# Patient Record
Sex: Female | Born: 1945 | Race: White | Hispanic: No | Marital: Married | Smoking: Never smoker
Health system: Southern US, Community
[De-identification: ages and names within clinical notes are randomized; demographics above are authoritative.]

## PROBLEM LIST (undated history)

## (undated) DIAGNOSIS — Z923 Personal history of irradiation: Secondary | ICD-10-CM

## (undated) DIAGNOSIS — C541 Malignant neoplasm of endometrium: Secondary | ICD-10-CM

## (undated) HISTORY — DX: Personal history of irradiation: Z92.3

---

## 2015-05-24 ENCOUNTER — Emergency Department (HOSPITAL_COMMUNITY): Payer: Medicare Other

## 2015-05-24 ENCOUNTER — Encounter (HOSPITAL_COMMUNITY): Payer: Self-pay | Admitting: Emergency Medicine

## 2015-05-24 ENCOUNTER — Inpatient Hospital Stay (HOSPITAL_COMMUNITY)
Admission: EM | Admit: 2015-05-24 | Discharge: 2015-05-29 | DRG: 811 | Disposition: A | Discharge status: Home / Self Care | Payer: Medicare Other | Attending: Family Medicine | Admitting: Family Medicine

## 2015-05-24 DIAGNOSIS — Z8249 Family history of ischemic heart disease and other diseases of the circulatory system: Secondary | ICD-10-CM | POA: Diagnosis not present

## 2015-05-24 DIAGNOSIS — E871 Hypo-osmolality and hyponatremia: Secondary | ICD-10-CM | POA: Diagnosis present

## 2015-05-24 DIAGNOSIS — D259 Leiomyoma of uterus, unspecified: Secondary | ICD-10-CM | POA: Diagnosis present

## 2015-05-24 DIAGNOSIS — Z803 Family history of malignant neoplasm of breast: Secondary | ICD-10-CM

## 2015-05-24 DIAGNOSIS — D473 Essential (hemorrhagic) thrombocythemia: Secondary | ICD-10-CM | POA: Diagnosis present

## 2015-05-24 DIAGNOSIS — R634 Abnormal weight loss: Secondary | ICD-10-CM | POA: Diagnosis present

## 2015-05-24 DIAGNOSIS — R4702 Dysphasia: Secondary | ICD-10-CM | POA: Diagnosis present

## 2015-05-24 DIAGNOSIS — N63 Unspecified lump in unspecified breast: Secondary | ICD-10-CM | POA: Diagnosis present

## 2015-05-24 DIAGNOSIS — R011 Cardiac murmur, unspecified: Secondary | ICD-10-CM | POA: Diagnosis not present

## 2015-05-24 DIAGNOSIS — E43 Unspecified severe protein-calorie malnutrition: Secondary | ICD-10-CM | POA: Diagnosis present

## 2015-05-24 DIAGNOSIS — E860 Dehydration: Secondary | ICD-10-CM | POA: Diagnosis present

## 2015-05-24 DIAGNOSIS — N950 Postmenopausal bleeding: Secondary | ICD-10-CM | POA: Diagnosis present

## 2015-05-24 DIAGNOSIS — D72829 Elevated white blood cell count, unspecified: Secondary | ICD-10-CM | POA: Diagnosis present

## 2015-05-24 DIAGNOSIS — D508 Other iron deficiency anemias: Secondary | ICD-10-CM | POA: Diagnosis present

## 2015-05-24 DIAGNOSIS — B379 Candidiasis, unspecified: Secondary | ICD-10-CM | POA: Diagnosis present

## 2015-05-24 DIAGNOSIS — R1900 Intra-abdominal and pelvic swelling, mass and lump, unspecified site: Secondary | ICD-10-CM | POA: Diagnosis present

## 2015-05-24 DIAGNOSIS — Z6824 Body mass index (BMI) 24.0-24.9, adult: Secondary | ICD-10-CM | POA: Diagnosis not present

## 2015-05-24 DIAGNOSIS — B370 Candidal stomatitis: Secondary | ICD-10-CM | POA: Diagnosis present

## 2015-05-24 DIAGNOSIS — R1310 Dysphagia, unspecified: Secondary | ICD-10-CM | POA: Diagnosis present

## 2015-05-24 DIAGNOSIS — R718 Other abnormality of red blood cells: Secondary | ICD-10-CM | POA: Diagnosis present

## 2015-05-24 DIAGNOSIS — D649 Anemia, unspecified: Secondary | ICD-10-CM | POA: Diagnosis present

## 2015-05-24 DIAGNOSIS — N390 Urinary tract infection, site not specified: Secondary | ICD-10-CM | POA: Diagnosis present

## 2015-05-24 DIAGNOSIS — R1903 Right lower quadrant abdominal swelling, mass and lump: Secondary | ICD-10-CM | POA: Diagnosis not present

## 2015-05-24 DIAGNOSIS — R0602 Shortness of breath: Secondary | ICD-10-CM | POA: Diagnosis present

## 2015-05-24 LAB — BASIC METABOLIC PANEL
Anion gap: 8 (ref 5–15)
BUN: 11 mg/dL (ref 6–20)
CALCIUM: 9.1 mg/dL (ref 8.9–10.3)
CHLORIDE: 98 mmol/L — AB (ref 101–111)
CO2: 26 mmol/L (ref 22–32)
CREATININE: 0.48 mg/dL (ref 0.44–1.00)
GFR calc non Af Amer: >60 mL/min (ref >60)
GLUCOSE: 120 mg/dL — AB (ref 65–99)
Potassium: 3.9 mmol/L (ref 3.5–5.1)
Sodium: 132 mmol/L — ABNORMAL LOW (ref 135–145)

## 2015-05-24 LAB — CBC WITH DIFFERENTIAL/PLATELET
BASOS PCT: 0 %
Basophils Absolute: 0 10*3/uL (ref 0.0–0.1)
EOS PCT: 0 %
Eosinophils Absolute: 0 10*3/uL (ref 0.0–0.7)
HCT: 21.1 % — ABNORMAL LOW (ref 36.0–46.0)
HEMOGLOBIN: 6 g/dL — AB (ref 12.0–15.0)
Lymphocytes Relative: 6 %
Lymphs Abs: 2.3 10*3/uL (ref 0.7–4.0)
MCH: 17.8 pg — AB (ref 26.0–34.0)
MCHC: 28.4 g/dL — AB (ref 30.0–36.0)
MCV: 62.6 fL — AB (ref 78.0–100.0)
MONO ABS: 1.9 10*3/uL — AB (ref 0.1–1.0)
Monocytes Relative: 5 %
NEUTROS ABS: 34.1 10*3/uL — AB (ref 1.7–7.7)
NEUTROS PCT: 89 %
PLATELETS: 838 10*3/uL — AB (ref 150–400)
RBC: 3.37 MIL/uL — ABNORMAL LOW (ref 3.87–5.11)
RDW: 18.6 % — ABNORMAL HIGH (ref 11.5–15.5)
WBC: 38.3 10*3/uL — ABNORMAL HIGH (ref 4.0–10.5)

## 2015-05-24 LAB — HEPATIC FUNCTION PANEL
ALBUMIN: 1.8 g/dL — AB (ref 3.5–5.0)
ALT: 18 U/L (ref 14–54)
AST: 17 U/L (ref 15–41)
Alkaline Phosphatase: 171 U/L — ABNORMAL HIGH (ref 38–126)
BILIRUBIN TOTAL: 0.3 mg/dL (ref 0.3–1.2)
Bilirubin, Direct: 0.1 mg/dL (ref 0.1–0.5)
Indirect Bilirubin: 0.2 mg/dL — ABNORMAL LOW (ref 0.3–0.9)
TOTAL PROTEIN: 5.4 g/dL — AB (ref 6.5–8.1)

## 2015-05-24 LAB — URINALYSIS, ROUTINE W REFLEX MICROSCOPIC
BILIRUBIN URINE: NEGATIVE
GLUCOSE, UA: NEGATIVE mg/dL
KETONES UR: NEGATIVE mg/dL
NITRITE: NEGATIVE
PH: 5.5 (ref 5.0–8.0)
Protein, ur: 30 mg/dL — AB
SPECIFIC GRAVITY, URINE: 1.022 (ref 1.005–1.030)

## 2015-05-24 LAB — PROCALCITONIN: PROCALCITONIN: 0.36 ng/mL

## 2015-05-24 LAB — URINE MICROSCOPIC-ADD ON

## 2015-05-24 LAB — IRON AND TIBC
Iron: 6 ug/dL — ABNORMAL LOW (ref 28–170)
SATURATION RATIOS: 3 % — AB (ref 10.4–31.8)
TIBC: 196 ug/dL — ABNORMAL LOW (ref 250–450)
UIBC: 190 ug/dL

## 2015-05-24 LAB — FOLATE: Folate: 16.8 ng/mL (ref >5.9)

## 2015-05-24 LAB — PROTIME-INR
INR: 1.32 (ref 0.00–1.49)
PROTHROMBIN TIME: 16.5 s — AB (ref 11.6–15.2)

## 2015-05-24 LAB — RETICULOCYTES
RBC.: 3.24 MIL/uL — AB (ref 3.87–5.11)
RETIC COUNT ABSOLUTE: 97.2 10*3/uL (ref 19.0–186.0)
Retic Ct Pct: 3 % (ref 0.4–3.1)

## 2015-05-24 LAB — PREPARE RBC (CROSSMATCH)

## 2015-05-24 LAB — VITAMIN B12: Vitamin B-12: 1307 pg/mL — ABNORMAL HIGH (ref 180–914)

## 2015-05-24 LAB — POC OCCULT BLOOD, ED: FECAL OCCULT BLD: NEGATIVE

## 2015-05-24 LAB — FERRITIN: FERRITIN: 149 ng/mL (ref 11–307)

## 2015-05-24 LAB — LACTATE DEHYDROGENASE: LDH: 128 U/L (ref 98–192)

## 2015-05-24 LAB — APTT: APTT: 31 s (ref 24–37)

## 2015-05-24 MED ORDER — ACETAMINOPHEN 325 MG PO TABS
650.0000 mg | ORAL_TABLET | Freq: Four times a day (QID) | ORAL | Status: DC | PRN
Start: 2015-05-24 — End: 2015-05-29
  Administered 2015-05-27 – 2015-05-29 (×2): 650 mg via ORAL
  Filled 2015-05-24 (×2): qty 2

## 2015-05-24 MED ORDER — CETYLPYRIDINIUM CHLORIDE 0.05 % MT LIQD
7.0000 mL | Freq: Two times a day (BID) | OROMUCOSAL | Status: DC
  Administered 2015-05-29: 7 mL via OROMUCOSAL

## 2015-05-24 MED ORDER — DEXTROSE 5 % IV SOLN
1.0000 g | INTRAVENOUS | Status: DC
  Administered 2015-05-25 – 2015-05-26 (×2): 1 g via INTRAVENOUS
  Filled 2015-05-24 (×2): qty 10

## 2015-05-24 MED ORDER — IOHEXOL 300 MG/ML  SOLN
25.0000 mL | Freq: Once | INTRAMUSCULAR | Status: AC | PRN
Start: 2015-05-24 — End: 2015-05-24
  Administered 2015-05-24: 25 mL via ORAL

## 2015-05-24 MED ORDER — ONDANSETRON HCL 4 MG/2ML IJ SOLN: 4.0000 mg | Freq: Four times a day (QID) | INTRAMUSCULAR | Status: DC | PRN

## 2015-05-24 MED ORDER — ACETAMINOPHEN 325 MG PO TABS
650.0000 mg | ORAL_TABLET | Freq: Once | ORAL | Status: AC
  Administered 2015-05-24: 650 mg via ORAL
  Filled 2015-05-24: qty 2

## 2015-05-24 MED ORDER — SODIUM CHLORIDE 0.9 % IV SOLN
Freq: Once | INTRAVENOUS | Status: AC
  Administered 2015-05-24: via INTRAVENOUS

## 2015-05-24 MED ORDER — DOCUSATE SODIUM 100 MG PO CAPS
100.0000 mg | ORAL_CAPSULE | Freq: Two times a day (BID) | ORAL | Status: DC
  Filled 2015-05-24 (×6): qty 1

## 2015-05-24 MED ORDER — SODIUM CHLORIDE 0.9 % IJ SOLN
3.0000 mL | Freq: Two times a day (BID) | INTRAMUSCULAR | Status: DC
  Administered 2015-05-24 – 2015-05-28 (×8): 3 mL via INTRAVENOUS

## 2015-05-24 MED ORDER — VITAMIN B-1 100 MG PO TABS
100.0000 mg | ORAL_TABLET | Freq: Every day | ORAL | Status: DC
  Administered 2015-05-25 – 2015-05-29 (×5): 100 mg via ORAL
  Filled 2015-05-24 (×5): qty 1

## 2015-05-24 MED ORDER — DIPHENHYDRAMINE HCL 25 MG PO CAPS
25.0000 mg | ORAL_CAPSULE | Freq: Once | ORAL | Status: AC
  Administered 2015-05-24: 25 mg via ORAL
  Filled 2015-05-24: qty 1

## 2015-05-24 MED ORDER — FLUCONAZOLE IN SODIUM CHLORIDE 400-0.9 MG/200ML-% IV SOLN
400.0000 mg | INTRAVENOUS | Status: DC
  Administered 2015-05-24 – 2015-05-27 (×4): 400 mg via INTRAVENOUS
  Filled 2015-05-24 (×4): qty 200

## 2015-05-24 MED ORDER — CHLORHEXIDINE GLUCONATE 0.12 % MT SOLN
15.0000 mL | Freq: Two times a day (BID) | OROMUCOSAL | Status: DC
  Administered 2015-05-24 – 2015-05-28 (×4): 15 mL via OROMUCOSAL
  Filled 2015-05-24 (×7): qty 15

## 2015-05-24 MED ORDER — IOHEXOL 300 MG/ML  SOLN
100.0000 mL | Freq: Once | INTRAMUSCULAR | Status: AC | PRN
  Administered 2015-05-24: 100 mL via INTRAVENOUS

## 2015-05-24 MED ORDER — SENNA 8.6 MG PO TABS
1.0000 | ORAL_TABLET | Freq: Two times a day (BID) | ORAL | Status: DC
  Filled 2015-05-24 (×6): qty 1

## 2015-05-24 MED ORDER — FOLIC ACID 1 MG PO TABS
1.0000 mg | ORAL_TABLET | Freq: Every day | ORAL | Status: DC
  Administered 2015-05-25 – 2015-05-29 (×5): 1 mg via ORAL
  Filled 2015-05-24 (×5): qty 1

## 2015-05-24 MED ORDER — DEXTROSE 5 % IV SOLN
1.0000 g | Freq: Once | INTRAVENOUS | Status: AC
  Administered 2015-05-24: 1 g via INTRAVENOUS
  Filled 2015-05-24: qty 10

## 2015-05-24 MED ORDER — SODIUM CHLORIDE 0.9 % IV SOLN
Freq: Once | INTRAVENOUS | Status: AC
  Administered 2015-05-24: 10 mL via INTRAVENOUS

## 2015-05-24 MED ORDER — ONDANSETRON HCL 4 MG PO TABS: 4.0000 mg | ORAL_TABLET | Freq: Four times a day (QID) | ORAL | Status: DC | PRN

## 2015-05-24 MED ORDER — ACETAMINOPHEN 650 MG RE SUPP
650.0000 mg | Freq: Four times a day (QID) | RECTAL | Status: DC | PRN
Start: 2015-05-24 — End: 2015-05-29

## 2015-05-24 NOTE — ED Notes (Signed)
Unable to obtain lab work due to patient receiving blood at this time.
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
## 2015-05-24 NOTE — ED Notes (Signed)
Patient presents from home via EMS for weakness and sob with exertion. Patient had labs drawn at PCP yesterday, HBG 6.1, advised to come here for transfusion.   Last VS: 140/60, 100hr, 98%ra, 16resp

## 2015-05-24 NOTE — ED Notes (Signed)
Hgb 6.0 Called by lab RN aware.
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
## 2015-05-24 NOTE — Progress Notes (Signed)
Pt states she saw Ovid Curd at [place] Could not recall his last name  Cm provided her with Cristela Blue Hamrick and Cyndi Bender information  EPIC updated

## 2015-05-24 NOTE — Progress Notes (Signed)
ANTIBIOTIC CONSULT NOTE - INITIAL  Pharmacy Consult for Fluconazole Indication: Esophagitis  No Known Allergies  Patient Measurements: Height: 5' (152.4 cm) Weight: 123 lb 0.3 oz (55.8 kg) IBW/kg (Calculated) : 45.5  Vital Signs: Temp: 99.3 F (37.4 C) (12/16 2159) Temp Source: Oral (12/16 2159) BP: 143/56 mmHg (12/16 2159) Pulse Rate: 102 (12/16 2159) Intake/Output from previous day:   Intake/Output from this shift:    Labs:  Recent Labs  05/24/15 1555  WBC 38.3*  HGB 6.0*  PLT 838*  CREATININE 0.48   Estimated Creatinine Clearance: 52 mL/min (by C-G formula based on Cr of 0.48). No results for input(s): VANCOTROUGH, VANCOPEAK, VANCORANDOM, GENTTROUGH, GENTPEAK, GENTRANDOM, TOBRATROUGH, TOBRAPEAK, TOBRARND, AMIKACINPEAK, AMIKACINTROU, AMIKACIN in the last 72 hours.   Microbiology: No results found for this or any previous visit (from the past 720 hour(s)).  Medical History: History reviewed. No pertinent past medical history.  Medications:  Scheduled:  . sodium chloride   Intravenous Once  . acetaminophen  650 mg Oral Once  . [START ON 05/25/2015] cefTRIAXone (ROCEPHIN) IVPB 1 gram/50 mL D5W  1 g Intravenous Q24H  . diphenhydrAMINE  25 mg Oral Once  . docusate sodium  100 mg Oral BID  . fluconazole (DIFLUCAN) IV  400 mg Intravenous Q24H  . [START ON [id number] folic acid  1 mg Oral Daily  . senna  1 tablet Oral BID  . sodium chloride  3 mL Intravenous Q12H  . [START ON 05/25/2015] thiamine  100 mg Oral Daily   Infusions:   Assessment:  69 yr female with no known significant PMH, presents with shortness of breath and fatigue, difficulty swallowing, recent 20 lb weight loss.  CT abdomen notes abdominal mass  Pt with thrush and concerned for esophagitis  Pharmacy consulted to dose IV fluconazole  12/16 >>Ceftriaxone >> 12/16 >>Fluconazole >>    12/16 blood: 12/16 urine:  Goal of Therapy:  Eradication of infection  Plan:  Follow up culture  results  Fluconazole 400mg  IV q24h Follow renal function  Takeela Peil, Toribio Harbour, PharmD 05/24/2015,10:30 PM

## 2015-05-25 ENCOUNTER — Inpatient Hospital Stay (HOSPITAL_COMMUNITY): Payer: Medicare Other

## 2015-05-25 DIAGNOSIS — R011 Cardiac murmur, unspecified: Secondary | ICD-10-CM

## 2015-05-25 LAB — CBC WITH DIFFERENTIAL/PLATELET
BASOS PCT: 0 %
Basophils Absolute: 0 10*3/uL (ref 0.0–0.1)
Basophils Absolute: 0 10*3/uL (ref 0.0–0.1)
Basophils Relative: 0 %
EOS ABS: 0 10*3/uL (ref 0.0–0.7)
EOS PCT: 0 %
EOS PCT: 0 %
Eosinophils Absolute: 0 10*3/uL (ref 0.0–0.7)
HCT: 32.1 % — ABNORMAL LOW (ref 36.0–46.0)
HEMATOCRIT: 28.5 % — AB (ref 36.0–46.0)
HEMOGLOBIN: 9.8 g/dL — AB (ref 12.0–15.0)
HEMOGLOBIN: 8.9 g/dL — AB (ref 12.0–15.0)
LYMPHS ABS: 1.9 10*3/uL (ref 0.7–4.0)
Lymphocytes Relative: 6 %
Lymphocytes Relative: 5 %
Lymphs Abs: 2.3 10*3/uL (ref 0.7–4.0)
MCH: 21.3 pg — AB (ref 26.0–34.0)
MCH: 21.4 pg — ABNORMAL LOW (ref 26.0–34.0)
MCHC: 30.5 g/dL (ref 30.0–36.0)
MCHC: 31.2 g/dL (ref 30.0–36.0)
MCV: 69.6 fL — AB (ref 78.0–100.0)
MCV: 68.7 fL — ABNORMAL LOW (ref 78.0–100.0)
MONO ABS: 1.9 10*3/uL — AB (ref 0.1–1.0)
MONOS PCT: 5 %
Monocytes Absolute: 1.9 10*3/uL — ABNORMAL HIGH (ref 0.1–1.0)
Monocytes Relative: 5 %
NEUTROS ABS: 34.1 10*3/uL — AB (ref 1.7–7.7)
NEUTROS PCT: 89 %
NEUTROS PCT: 90 %
Neutro Abs: 33.2 10*3/uL — ABNORMAL HIGH (ref 1.7–7.7)
PLATELETS: 683 10*3/uL — AB (ref 150–400)
Platelets: 684 10*3/uL — ABNORMAL HIGH (ref 150–400)
RBC: 4.61 MIL/uL (ref 3.87–5.11)
RBC: 4.15 MIL/uL (ref 3.87–5.11)
RDW: 23.9 % — ABNORMAL HIGH (ref 11.5–15.5)
RDW: 23.8 % — ABNORMAL HIGH (ref 11.5–15.5)
WBC: 38.3 10*3/uL — ABNORMAL HIGH (ref 4.0–10.5)
WBC: 37 10*3/uL — AB (ref 4.0–10.5)

## 2015-05-25 LAB — LIPID PANEL
CHOL/HDL RATIO: 3.6 ratio
CHOLESTEROL: 90 mg/dL (ref 0–200)
HDL: 25 mg/dL — ABNORMAL LOW (ref >40)
LDL CALC: 47 mg/dL (ref 0–99)
TRIGLYCERIDES: 89 mg/dL (ref <150)
VLDL: 18 mg/dL (ref 0–40)

## 2015-05-25 LAB — COMPREHENSIVE METABOLIC PANEL
ALT: 21 U/L (ref 14–54)
ANION GAP: 8 (ref 5–15)
AST: 17 U/L (ref 15–41)
Albumin: 2 g/dL — ABNORMAL LOW (ref 3.5–5.0)
Alkaline Phosphatase: 181 U/L — ABNORMAL HIGH (ref 38–126)
BUN: 9 mg/dL (ref 6–20)
CHLORIDE: 104 mmol/L (ref 101–111)
CO2: 26 mmol/L (ref 22–32)
Calcium: 9.7 mg/dL (ref 8.9–10.3)
Creatinine, Ser: 0.48 mg/dL (ref 0.44–1.00)
GFR calc non Af Amer: >60 mL/min (ref >60)
Glucose, Bld: 104 mg/dL — ABNORMAL HIGH (ref 65–99)
POTASSIUM: 4.7 mmol/L (ref 3.5–5.1)
SODIUM: 138 mmol/L (ref 135–145)
Total Bilirubin: 1.5 mg/dL — ABNORMAL HIGH (ref 0.3–1.2)
Total Protein: 5.9 g/dL — ABNORMAL LOW (ref 6.5–8.1)

## 2015-05-25 LAB — BRAIN NATRIURETIC PEPTIDE
B NATRIURETIC PEPTIDE 5: 190.8 pg/mL — AB (ref 0.0–100.0)
B Natriuretic Peptide: 261.5 pg/mL — ABNORMAL HIGH (ref 0.0–100.0)

## 2015-05-25 LAB — TSH: TSH: 2.31 u[IU]/mL (ref 0.350–4.500)

## 2015-05-25 LAB — TROPONIN I
Troponin I: <0.03 ng/mL (ref <0.031)
Troponin I: <0.03 ng/mL (ref <0.031)

## 2015-05-25 LAB — CORTISOL-AM, BLOOD: Cortisol - AM: 27.7 ug/dL — ABNORMAL HIGH (ref 6.7–22.6)

## 2015-05-25 LAB — HIV ANTIBODY (ROUTINE TESTING W REFLEX): HIV Screen 4th Generation wRfx: NONREACTIVE

## 2015-05-25 LAB — PREPARE RBC (CROSSMATCH)

## 2015-05-25 LAB — ABO/RH: ABO/RH(D): O POS

## 2015-05-25 LAB — PHOSPHORUS
Phosphorus: 3.2 mg/dL (ref 2.5–4.6)
Phosphorus: 3.7 mg/dL (ref 2.5–4.6)

## 2015-05-25 LAB — PROTIME-INR
INR: 1.39 (ref 0.00–1.49)
Prothrombin Time: 17.1 seconds — ABNORMAL HIGH (ref 11.6–15.2)

## 2015-05-25 LAB — HAPTOGLOBIN: HAPTOGLOBIN: 422 mg/dL — AB (ref 34–200)

## 2015-05-25 LAB — APTT: aPTT: 35 seconds (ref 24–37)

## 2015-05-25 LAB — CK: CK TOTAL: 10 U/L — AB (ref 38–234)

## 2015-05-25 LAB — PREALBUMIN: PREALBUMIN: 3.9 mg/dL — AB (ref 18–38)

## 2015-05-25 LAB — MAGNESIUM: MAGNESIUM: 1.9 mg/dL (ref 1.7–2.4)

## 2015-05-25 MED ORDER — NYSTATIN 100000 UNIT/GM EX POWD
Freq: Two times a day (BID) | CUTANEOUS | Status: DC
  Filled 2015-05-25: qty 15

## 2015-05-25 MED ORDER — NYSTATIN 100000 UNIT/ML MT SUSP
5.0000 mL | Freq: Two times a day (BID) | OROMUCOSAL | Status: DC
  Administered 2015-05-25 – 2015-05-29 (×9): 500000 [IU] via ORAL
  Filled 2015-05-25 (×9): qty 5
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
## 2015-05-25 NOTE — Evaluation (Signed)
Clinical/Bedside Swallow Evaluation Patient Details  Name: Cindy Anderson MRN: [id number] Date of Birth: 28-Aug-1945  Today's Date: 05/25/2015 Time: SLP Start Time (ACUTE ONLY): [id number] SLP Stop Time (ACUTE ONLY): 1346 SLP Time Calculation (min) (ACUTE ONLY): 18 min  Past Medical History: History reviewed. No pertinent past medical history. Past Surgical History: History reviewed. No pertinent past surgical history. HPI:  Patient have been gradually getting more short of breath over past month and has some fatigue. She reports having trouble swallowing due to food getting stuck in her throat. She reports ankle swelling since October that has getting worse. She denies Pica, no melena, no blood in stools, no vaginal bleeding. She never had colonoscopy or mammogram due to lack of medical care. Reports area that she lives in hard to get a PCP. She reports loss of energy Since November. For the past months she has sensation of choking on food. She thinks she lost over 20 Lb since November. She reports food aversions. She reports burning in her mouth. For the past month. Yesterday she presented to a physician at [facility] and had lab work done that showed hemoglobin of 6.1 at which point she was told to present to emergency department. Patient called 911 and was brought to [facility] along emergency department by EMS.   In emergency department patient was found to have hemoglobin of 6.0. White blood cell count 38.3 predominately neutrophils 89 percentile. Platelets of 838 MCV 62.6 monocytes absolute count being slightly elevated 149 lymphocytes absolute count, was 2.3    Assessment / Plan / Recommendation Clinical Impression  Clinical swallowing evaluation was completed.  Oral mechnism exam was normal.  The patient's oral and pharyngeal swallow appeared to be functional.  Swallow trigger was timely and hyo-laryngeal excursion was deemed adequate.  Mastication of dry solids was functional.  Overt s/s of  aspiration were not observed.  The patient complained of material sticking just above the sternal notch.  Liquids facilitated clearance of that sensation.  The patient felt that things had been improving since initiation of treatment for her thrush.  Her symptoms are most consistent with esopahgeal dysphagia.  Recommend continue with a regular diet and thin liquids.  Alternating food/liquids and sitting up for at least 30 mins after intake to facilitate esophageal clearance.  ST to follow up for therapeutic diet tolerance and education.      Aspiration Risk  Mild aspiration risk    Diet Recommendation   Regular diet and thin liquids.    Medication Administration: Whole meds with liquid    Other  Recommendations Oral Care Recommendations: Oral care BID   Follow up Recommendations   (TBD)    Frequency and Duration min 2x/week  2 weeks       Prognosis Prognosis for Safe Diet Advancement: Good      Swallow Study   General Date of Onset: 05/24/15 HPI: Patient have been gradually getting more short of breath over past month and has some fatigue. She reports having trouble swallowing due to food getting stuck in her throat. She reports ankle swelling since October that has getting worse. She denies Pica, no melena, no blood in stools, no vaginal bleeding. She never had colonoscopy or mammogram due to lack of medical care. Reports area that she lives in hard to get a PCP. She reports loss of energy Since November. For the past months she has sensation of choking on food. She thinks she lost over 20 Lb since November. She reports food aversions. She  reports burning in her mouth. For the past month. Yesterday she presented to a physician at [facility] and had lab work done that showed hemoglobin of 6.1 at which point she was told to present to emergency department. Patient called 911 and was brought to [facility] along emergency department by EMS.   In emergency department patient was found  to have hemoglobin of 6.0. White blood cell count 38.3 predominately neutrophils 89 percentile. Platelets of 838 MCV 62.6 monocytes absolute count being slightly elevated 149 lymphocytes absolute count, was 2.3  Type of Study: Bedside Swallow Evaluation Previous Swallow Assessment: None noted.   Diet Prior to this Study: Regular;Thin liquids Temperature Spikes Noted: Yes Respiratory Status: Room air History of Recent Intubation: No Behavior/Cognition: Alert;Cooperative;Pleasant mood Oral Cavity Assessment: Other (comment) (White patches noted on her tongue c/w thrush.  ) Oral Care Completed by SLP: No Oral Cavity - Dentition: Adequate natural dentition Vision: Functional for self-feeding Self-Feeding Abilities: Able to feed self Patient Positioning: Upright in chair Baseline Vocal Quality: Normal Volitional Cough: Strong Volitional Swallow: Able to elicit    Oral/Motor/Sensory Function Overall Oral Motor/Sensory Function: Within functional limits   Ice Chips Ice chips: Not tested   Thin Liquid Thin Liquid: Within functional limits Presentation: Cup;Self Fed;Spoon    Nectar Thick Nectar Thick Liquid: Not tested   Honey Thick Honey Thick Liquid: Not tested   Puree Puree: Within functional limits Presentation: Spoon;Self Fed   Solid Solid: Within functional limits Presentation: Cindy Anderson, Cindy Anderson, Cindy Anderson [phone] Cindy Anderson N 05/25/2015,1:54 PM

## 2015-05-25 NOTE — Progress Notes (Signed)
Echocardiogram 2D Echocardiogram has been performed.  Cindy Anderson 05/25/2015, 9:20 AM

## 2015-05-26 DIAGNOSIS — R718 Other abnormality of red blood cells: Secondary | ICD-10-CM

## 2015-05-26 DIAGNOSIS — N63 Unspecified lump in breast: Secondary | ICD-10-CM

## 2015-05-26 LAB — COMPREHENSIVE METABOLIC PANEL
ALBUMIN: 1.7 g/dL — AB (ref 3.5–5.0)
ALT: 17 U/L (ref 14–54)
AST: 18 U/L (ref 15–41)
Alkaline Phosphatase: 168 U/L — ABNORMAL HIGH (ref 38–126)
Anion gap: 8 (ref 5–15)
BUN: 9 mg/dL (ref 6–20)
CHLORIDE: 106 mmol/L (ref 101–111)
CO2: 24 mmol/L (ref 22–32)
Calcium: 9.2 mg/dL (ref 8.9–10.3)
Creatinine, Ser: 0.5 mg/dL (ref 0.44–1.00)
GFR calc Af Amer: >60 mL/min (ref >60)
GFR calc non Af Amer: >60 mL/min (ref >60)
GLUCOSE: 106 mg/dL — AB (ref 65–99)
POTASSIUM: 3.8 mmol/L (ref 3.5–5.1)
SODIUM: 138 mmol/L (ref 135–145)
Total Bilirubin: 0.6 mg/dL (ref 0.3–1.2)
Total Protein: 5.1 g/dL — ABNORMAL LOW (ref 6.5–8.1)

## 2015-05-26 LAB — CBC WITH DIFFERENTIAL/PLATELET
BASOS PCT: 0 %
Basophils Absolute: 0 10*3/uL (ref 0.0–0.1)
EOS PCT: 0 %
Eosinophils Absolute: 0 10*3/uL (ref 0.0–0.7)
HEMATOCRIT: 26.7 % — AB (ref 36.0–46.0)
Hemoglobin: 8.3 g/dL — ABNORMAL LOW (ref 12.0–15.0)
LYMPHS ABS: 1.9 10*3/uL (ref 0.7–4.0)
Lymphocytes Relative: 6 %
MCH: 21.3 pg — AB (ref 26.0–34.0)
MCHC: 31.1 g/dL (ref 30.0–36.0)
MCV: 68.6 fL — AB (ref 78.0–100.0)
MONOS PCT: 5 %
Monocytes Absolute: 1.6 10*3/uL — ABNORMAL HIGH (ref 0.1–1.0)
NEUTROS ABS: 28.2 10*3/uL — AB (ref 1.7–7.7)
Neutrophils Relative %: 89 %
Platelets: 579 10*3/uL — ABNORMAL HIGH (ref 150–400)
RBC: 3.89 MIL/uL (ref 3.87–5.11)
RDW: 24.3 % — AB (ref 11.5–15.5)
WBC: 31.7 10*3/uL — AB (ref 4.0–10.5)

## 2015-05-26 LAB — URINE CULTURE

## 2015-05-26 MED ORDER — SODIUM CHLORIDE 0.9 % IV SOLN
510.0000 mg | Freq: Once | INTRAVENOUS | Status: AC
  Administered 2015-05-26: 510 mg via INTRAVENOUS
  Filled 2015-05-26: qty 17

## 2015-05-26 MED ORDER — ENSURE ENLIVE PO LIQD
237.0000 mL | Freq: Two times a day (BID) | ORAL | Status: DC
  Administered 2015-05-26 – 2015-05-29 (×5): 237 mL via ORAL
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
## 2015-05-26 NOTE — Progress Notes (Signed)
Initial Nutrition Assessment  DOCUMENTATION CODES:   Severe malnutrition in context of acute illness/injury  INTERVENTION:   Provide Ensure Enlive po BID, each supplement provides 350 kcal and 20 grams of protein Encourage PO intake RD to continue to monitor  NUTRITION DIAGNOSIS:   Malnutrition related to acute illness as evidenced by percent weight loss, energy intake < or equal to 50% for > or equal to 5 days, moderate depletion of body fat, moderate depletions of muscle mass.  GOAL:   Patient will meet greater than or equal to 90% of their needs  MONITOR:   PO intake, Supplement acceptance, Labs, Weight trends, Skin, I & O's  REASON FOR ASSESSMENT:   Consult Assessment of nutrition requirement/status  ASSESSMENT:   Patient have been gradually getting more short of breath over past month and has some fatigue. She reports having trouble swallowing due to food getting stuck in her throat. She reports ankle swelling since October that has getting worse. She denies Pica, no melena, no blood in stools, no vaginal bleeding. She never had colonoscopy or mammogram due to lack of medical care. Reports area that she lives in hard to get a PCP. She reports loss of energy Since November. For the past months she has sensation of choking on food. She thinks she lost over 20 Lb since November.  Pt reports around 20 lb weight loss since November when her swallowing issues began. Pt developed thrush during this time as well. Pt reports food getting stuck in her throat and then she started having issues with liquids as well. SLP evaluated pt and recommends regular diet and thin liquids with mild aspiration precautions. Pt states she is to have another swallow eval tomorrow. RD to order pt Ensure supplements.   Pt has lost 25 lb since November (17% weight loss x 1.5 months, significant for time frame).  Nutrition-Focused physical exam completed. Findings are moderate fat depletion, moderate  muscle depletion, and moderate edema.   Labs reviewed: Mg/Phos WNL  Diet Order:  Diet regular Room service appropriate?: Yes; Fluid consistency:: Thin  Skin:  Reviewed, no issues  Last BM:  12/17  Height:   Ht Readings from Last 1 Encounters:  05/24/15 5' (1.524 m)    Weight:   Wt Readings from Last 1 Encounters:  05/24/15 123 lb 0.3 oz (55.8 kg)    Ideal Body Weight:  45.5 kg  BMI:  Body mass index is 24.03 kg/(m^2).  Estimated Nutritional Needs:   Kcal:  1650-1850  Protein:  75-85g  Fluid:  1.8L/day  EDUCATION NEEDS:   Education needs addressed  Clayton Bibles, MS, RD, LDN Pager: [phone] After Hours Pager: [phone]

## 2015-05-26 NOTE — Evaluation (Signed)
Physical Therapy Evaluation-1x Patient Details Name: Branesha Mcintyre MRN: [id number] DOB: August 23, 1945 Today's Date: 05/26/2015   History of Present Illness  69 yo female admitted with symptomatic anemia, weakness.   Clinical Impression  On eval, pt was supervision level for mobility-walked ~500 feet. No overt LOB. Mildly unsteady at times. Encouraged pt to continue ambulating in hallway. Do not anticipate any follow up PT needs. Will sign off.     Follow Up Recommendations No PT follow up    Equipment Recommendations  None recommended by PT    Recommendations for Other Services       Precautions / Restrictions Restrictions Weight Bearing Restrictions: No      Mobility  Bed Mobility               General bed mobility comments: NT-pt sitting in window seat  Transfers Overall transfer level: Modified independent                  Ambulation/Gait Ambulation/Gait assistance: Supervision Ambulation Distance (Feet): 500 Feet Assistive device: None Gait Pattern/deviations: Step-through pattern;Decreased stride length     General Gait Details: slow gait speed. mild unsteadiness at times but no LOB  Financial trader Rankin (Stroke Patients Only)       Balance Overall balance assessment: No apparent balance deficits (not formally assessed)                                           Pertinent Vitals/Pain Pain Assessment: 0-10 Pain Score: 6  Pain Location: back pain Pain Intervention(s): Monitored during session    Home Living Family/patient expects to be discharged to:: Private residence (a Friend's home) Living Arrangements: Non-relatives/Friends   Type of Home: House       Home Layout: One level Home Equipment: None      Prior Function Level of Independence: Independent               Hand Dominance        Extremity/Trunk Assessment   Upper Extremity Assessment: Overall WFL  for tasks assessed           Lower Extremity Assessment: Overall WFL for tasks assessed      Cervical / Trunk Assessment: Normal  Communication   Communication: No difficulties  Cognition Arousal/Alertness: Awake/alert Behavior During Therapy: WFL for tasks assessed/performed Overall Cognitive Status: Within Functional Limits for tasks assessed                      General Comments      Exercises        Assessment/Plan    PT Assessment Patent does not need any further PT services (may need to consider Orthopedic consult if back pain continues)  PT Diagnosis     PT Problem List    PT Treatment Interventions     PT Goals (Current goals can be found in the Care Plan section) Acute Rehab PT Goals Patient Stated Goal: home soon.  PT Goal Formulation: All assessment and education complete, DC therapy    Frequency     Barriers to discharge        Co-evaluation               End of Session   Activity Tolerance: Patient tolerated treatment well Patient  left: in chair;with call bell/phone within reach           Time: 1450-1516 PT Time Calculation (min) (ACUTE ONLY): 26 min   Charges:   PT Evaluation $Initial PT Evaluation Tier I: 1 Procedure     PT G Codes:        Weston Anna, MPT Pager: [phone]

## 2015-05-27 ENCOUNTER — Inpatient Hospital Stay (HOSPITAL_COMMUNITY): Payer: Medicare Other

## 2015-05-27 ENCOUNTER — Other Ambulatory Visit: Payer: Self-pay | Admitting: Oncology

## 2015-05-27 DIAGNOSIS — D630 Anemia in neoplastic disease: Secondary | ICD-10-CM

## 2015-05-27 LAB — COMPREHENSIVE METABOLIC PANEL
ALT: 15 U/L (ref 14–54)
AST: 14 U/L — AB (ref 15–41)
Albumin: 1.6 g/dL — ABNORMAL LOW (ref 3.5–5.0)
Alkaline Phosphatase: 143 U/L — ABNORMAL HIGH (ref 38–126)
Anion gap: 6 (ref 5–15)
BUN: 9 mg/dL (ref 6–20)
CHLORIDE: 106 mmol/L (ref 101–111)
CO2: 26 mmol/L (ref 22–32)
CREATININE: 0.47 mg/dL (ref 0.44–1.00)
Calcium: 8.8 mg/dL — ABNORMAL LOW (ref 8.9–10.3)
GFR calc Af Amer: >60 mL/min (ref >60)
Glucose, Bld: 126 mg/dL — ABNORMAL HIGH (ref 65–99)
Potassium: 3.7 mmol/L (ref 3.5–5.1)
Sodium: 138 mmol/L (ref 135–145)
Total Bilirubin: 0.6 mg/dL (ref 0.3–1.2)
Total Protein: 4.6 g/dL — ABNORMAL LOW (ref 6.5–8.1)

## 2015-05-27 LAB — HEMOGLOBINOPATHY EVALUATION
HGB F QUANT: 0 % (ref 0.0–2.0)
Hgb A2 Quant: 1.8 % (ref 0.7–3.1)
Hgb A: 98.2 % — ABNORMAL HIGH (ref 94.0–98.0)
Hgb C: 0 %
Hgb S Quant: 0 %

## 2015-05-27 LAB — CBC
HEMATOCRIT: 28.4 % — AB (ref 36.0–46.0)
HEMOGLOBIN: 8.6 g/dL — AB (ref 12.0–15.0)
MCH: 21.5 pg — ABNORMAL LOW (ref 26.0–34.0)
MCHC: 30.3 g/dL (ref 30.0–36.0)
MCV: 71 fL — ABNORMAL LOW (ref 78.0–100.0)
Platelets: 681 10*3/uL — ABNORMAL HIGH (ref 150–400)
RBC: 4 MIL/uL (ref 3.87–5.11)
RDW: 25.6 % — ABNORMAL HIGH (ref 11.5–15.5)
WBC: 32.2 10*3/uL — AB (ref 4.0–10.5)

## 2015-05-27 LAB — HEMOGLOBIN A1C
HEMOGLOBIN A1C: 6.6 % — AB (ref 4.8–5.6)
Mean Plasma Glucose: 143 mg/dL

## 2015-05-27 LAB — CALCIUM, IONIZED: CALCIUM, IONIZED, SERUM: 5.7 mg/dL — AB (ref 4.5–5.6)

## 2015-05-27 NOTE — Progress Notes (Unsigned)
I met with patient and her husband approximately 30 minutes this evening to go over her situation. She could simply have post menopausal uterine bleeding due to fibroids, with consequejnt iron deficiency explaining the microcytic anemia and thrombocytosis, with the leukocytosis reactive to this process (possibly intrauterine inflammation). Or she could have uterine cancer, and/or chronic myeloid leukemia, and in that case the thrombocytosis would be part of the neoplastic process. She could also carry a thalassemia.  We went over all this and I have made her a new patient appt with me for 1/6 . However if she has not yet had the anticipated hysterectomy by then I will postpone the visit.  She has a good understanding of this plan and agrees with it. Please let me know if I can be of further help.

## 2015-05-27 NOTE — Progress Notes (Addendum)
Speech Language Pathology Treatment: Dysphagia  Patient Details Name: Cindy Anderson MRN: [id number] DOB: 10/20/1945 Today's Date: 05/27/2015 Time: 0962-8366 SLP Time Calculation (min) (ACUTE ONLY): 30 min  Assessment / Plan / Recommendation Clinical Impression  Note results of esophagram showing pill lodging in lower cervical esophagus - with pt awareness per her statement.  Pt also with mild reflux and small sliding-scale hiatal hernia.   Pt verbalized sensing pill lodging and food/pills lodging 1-2 times a day since mid November requiring her to "take deep breaths" or drink gingerale/water mixture to aid clearance.  She does report improvement with thrush treatment as she has decreased pain with swallowing.  Gustatory changes also reported by pt occuring prior to admission.    SLP reviewed compensations to mitigate dysphagia symptoms and provided with pt being able to teach back.  Will sign off at this time as pt with primary esophageal deficits and she appears to be managing well.  She expressed gratitude for information.      HPI HPI: Patient have been gradually getting more short of breath over past month and has some fatigue. She reports having trouble swallowing due to food getting stuck in her throat. She reports ankle swelling since October that has getting worse. She denies Pica, no melena, no blood in stools, no vaginal bleeding. She never had colonoscopy or mammogram due to lack of medical care. Reports area that she lives in hard to get a PCP. She reports loss of energy Since November. For the past months she has sensation of choking on food. She thinks she lost over 20 Lb since November. She reports food aversions. She reports burning in her mouth. For the past month. Yesterday she presented to a physician at [facility] and had lab work done that showed hemoglobin of 6.1 at which point she was told to present to emergency department. Patient called 911 and was brought to  [facility] along emergency department by EMS.   In emergency department patient was found to have hemoglobin of 6.0. White blood cell count 38.3 predominately neutrophils 89 percentile. Platelets of 838 MCV 62.6 monocytes absolute count being slightly elevated 149 lymphocytes absolute count, was 2.3       SLP Plan  All goals met;Discharge SLP treatment due to (comment)     Recommendations  Medication Administration: Whole meds with liquid Supervision: Patient able to self feed Compensations: Slow rate;Small sips/bites (start meal with liquids, follow solids with liquids) Postural Changes and/or Swallow Maneuvers: Seated upright 90 degrees;Upright 30-60 min after meal              Oral Care Recommendations: Oral care BID Follow up Recommendations: None (TBD) Plan: All goals met;Discharge SLP treatment due to (comment)  Luanna Salk, [place] [facility] SLP [phone]

## 2015-05-27 NOTE — Progress Notes (Signed)
Pharmacy Antibiotic Follow-up Note  Cindy Anderson is a 69 y.o. year-old female admitted on 05/24/2015.  The patient is currently on day 4 of Fluconazole 400mg  IV daily for thrush, esophagitis, dysphagia.  Assessment/Plan: This patient's current antibiotics will be continued without adjustments.  Temp (24hrs), Avg:99 F (37.2 C), Min:98.1 F (36.7 C), Max:99.8 F (37.7 C)   Recent Labs Lab 05/24/15 1555 05/25/15 0532 05/25/15 1243 05/26/15 0516  WBC 38.3* 38.3* 37.0* 31.7*    Recent Labs Lab 05/24/15 1555 05/25/15 0532 05/26/15 0516 05/27/15 0414  CREATININE 0.48 0.48 0.50 0.47   Estimated Creatinine Clearance: 52 mL/min (by C-G formula based on Cr of 0.47).    No Known Allergies  Antimicrobials this admission: 12/16 >> CTX >> 12/19 12/16 >> Fluconazole >>   Levels/dose changes this admission:  None  Microbiology results: 12/16 blood x 2: NGTD 12/16 urine: multiple species, suggest recollection 12/17 HIV antibody: non-reactive  Thank you for allowing pharmacy to be a part of this patient's care.  Gretta Arab PharmD, BCPS Pager [phone] 05/27/2015 1:40 PM

## 2015-05-27 NOTE — Care Management Important Message (Signed)
Important Message  Patient Details  Name: Cindy Anderson MRN: [id number] Date of Birth: [birth year]   Medicare Important Message Given:  Yes    Camillo Flaming 05/27/2015, 11:42 AMImportant Message  Patient Details  Name: Cindy Anderson MRN: [id number] Date of Birth: [birth year]   Medicare Important Message Given:  Yes    Camillo Flaming 05/27/2015, 11:42 AM

## 2015-05-27 NOTE — Progress Notes (Signed)
[facility] PROGRESS NOTE  Cindy Anderson [id number] DOB: 22-Oct-1945 DOA: 05/24/2015 PCP: [facility]  Assessment/Plan: 1. Severe Iron defi anemia - suspect due to ongoing post menopausal bleeding due for months now -CT with markedly enlarged uterus with large fibroid with central degeneration -S/p 2 units PRBC -I called and d/w GYN on call, Dr.Arnold, he felt that this long standing issue needs workup and management but could be done in outpatient setting, and that not uncommon to see gas in degenerating fibroid -made FU with Dr.Arnold for 12/22 -given IV Iron 12/18, repeat in 1-2weeks  2. Leukocytosis/thrombocytosis - etiology unclear, i called and d/w Dr.Magrinat, thrombocytosis could be due to severe Iron deficiency -degree of leukocytosis still unexplained, will order BCl-ABL to r/o CLL and Hemoglobin electrophoresis to r/o thalassemia per Dr.Magrinat -Smear with mild left shift, schistocytes, LDH and haptoglobin WNL -no s/s of ongoing infection, afebrile, blood Cx negative -abnormal UA, on Ceftriaxone, FU Cx-do not think this is accounting for degree of leukocytosis, Cx negative stop ceftriaxone  3. Dysphagia/Thrush -Diflucan/Nystatin -SLP eval completed,  Esophagram without overt findings , could have esoph webs associated with iron defi -FU with GI  4. Hyponatremia -from volume depletion, improved  5. Severe malnutrition -RD consult  6. Breast nodule -needs Mammogram upon FU with Gyn  DVT proph; SCDs  Code Status: Full Code Family Communication: none at bedside Disposition Plan:Home tomorrow   Consultants:  D/w GYN  Heme -pending  HPI/Subjective: Feels ok, intermittent vaginal bleeding for months  Objective: Filed Vitals:   05/27/15 0605 05/27/15 1529  BP: 149/60 125/68  Pulse: 94 83  Temp: 98.1 F (36.7 C) 98.3 F (36.8 C)  Resp: 20 20    Intake/Output Summary (Last 24 hours) at 05/27/15 1754 Last data filed at 05/27/15  1100  Gross per 24 hour  Intake    730 ml  Output      0 ml  Net    730 ml   Filed Weights   05/24/15 2159  Weight: 55.8 kg (123 lb 0.3 oz)    Exam:   General:  AAOx3  Cardiovascular:S1S2/RRR  Respiratory: CTAB  Abdomen: soft, NT, BS present  Musculoskeletal: no edema c/c   Data Reviewed: Basic Metabolic Panel:  Recent Labs Lab 05/24/15 1555 05/24/15 2300 05/25/15 0532 05/26/15 0516 05/27/15 0414  NA 132*  --  138 138 138  K 3.9  --  4.7 3.8 3.7  CL 98*  --  104 106 106  CO2 26  --  26 24 26   GLUCOSE 120*  --  104* 106* 126*  BUN 11  --  9 9 9   CREATININE 0.48  --  0.48 0.50 0.47  CALCIUM 9.1  --  9.7 9.2 8.8*  MG  --   --  1.9  --   --   PHOS  --  3.2 3.7  --   --    Liver Function Tests:  Recent Labs Lab 05/24/15 1935 05/25/15 0532 05/26/15 0516 05/27/15 0414  AST 17 17 18  14*  ALT 18 21 17 15   ALKPHOS 171* 181* 168* 143*  BILITOT 0.3 1.5* 0.6 0.6  PROT 5.4* 5.9* 5.1* 4.6*  ALBUMIN 1.8* 2.0* 1.7* 1.6*   No results for input(s): LIPASE, AMYLASE in the last 168 hours. No results for input(s): AMMONIA in the last 168 hours. CBC:  Recent Labs Lab 05/24/15 1555 05/25/15 0532 05/25/15 1243 05/26/15 0516  WBC 38.3* 38.3* 37.0* 31.7*  NEUTROABS 34.1* 34.1* 33.2* 28.2*  HGB  6.0* 9.8* 8.9* 8.3*  HCT 21.1* 32.1* 28.5* 26.7*  MCV 62.6* 69.6* 68.7* 68.6*  PLT 838* 683* 684* 579*   Cardiac Enzymes:  Recent Labs Lab 05/24/15 2300 2015/06/14 0532  CKTOTAL 10*  --   TROPONINI <0.03 <0.03   BNP (last 3 results)  Recent Labs  05/24/15 2300 2015/06/14 0532  BNP 261.5* 190.8*    ProBNP (last 3 results) No results for input(s): PROBNP in the last 8760 hours.  CBG: No results for input(s): GLUCAP in the last 168 hours.  Recent Results (from the past 240 hour(s))  Blood culture (routine x 2)     Status: None (Preliminary result)   Collection Time: 05/24/15  6:20 PM  Result Value Ref Range Status   Specimen Description BLOOD RIGHT  ANTECUBITAL  Final   Special Requests IN PEDIATRIC BOTTLE 3 CC  Final   Culture   Final    NO GROWTH 3 DAYS Performed at [facility]    Report Status PENDING  Incomplete  Blood culture (routine x 2)     Status: None (Preliminary result)   Collection Time: 05/24/15  6:30 PM  Result Value Ref Range Status   Specimen Description BLOOD RIGHT HAND  Final   Special Requests BOTTLES DRAWN AEROBIC AND ANAEROBIC 5 CC  Final   Culture   Final    NO GROWTH 3 DAYS Performed at [facility]    Report Status PENDING  Incomplete  Urine culture     Status: None   Collection Time: 05/24/15 10:03 PM  Result Value Ref Range Status   Specimen Description URINE, CLEAN CATCH  Final   Special Requests NONE  Final   Culture   Final    MULTIPLE SPECIES PRESENT, SUGGEST RECOLLECTION Performed at [facility]    Report Status 05/26/2015 FINAL  Final     Studies: US Transvaginal Non-ob  06-14-15  CLINICAL DATA:  Fibroid uterus EXAM: TRANSABDOMINAL AND TRANSVAGINAL ULTRASOUND OF PELVIS TECHNIQUE: Both transabdominal and transvaginal ultrasound examinations of the pelvis were performed. Transabdominal technique was performed for global imaging of the pelvis including uterus, ovaries, adnexal regions, and pelvic cul-de-sac. It was necessary to proceed with endovaginal exam following the transabdominal exam to visualize the endometrium and ovaries. COMPARISON:  None FINDINGS: Uterus Measurements: Prominent size, 20.0 cm length by 9.5 cm AP x 13.5 cm transverse. Markedly heterogeneous and nodular appearing myometrium compatible with multiple leiomyomata as seen on CT. Multiple shadowing calcifications. Large mass at fundus 10.2 x 6.6 x 11.8 cm containing calcifications with shadowing, minimal fluid and probable foci of gas. Additional large solid-appearing leiomyoma at lower uterine segment 9.9 x 7.6 x 10.0 cm, also corresponding to leiomyoma on CT. Endometrium Thickness: Not visualized,  obscured by the numerous uterine masses identified. Right ovary Not visualized likely due to markedly enlarged uterus, displacement, and obscuration by bowel and uterine calcifications. Left ovary Not visualized likely due to markedly enlarged uterus, displacement and obscuration by bowel and uterine calcifications. Other findings No free fluid. IMPRESSION: Markedly enlarged nodular uterus containing multiple uterine leiomyomata, some which are calcified, others of which are solid or necrotic, overall better visualized on prior CT. Unable to exclude malignant degeneration of leiomyomata by ultrasound; if further uterine evaluation is required, MR imaging with and without contrast would be the preferred modality. Electronically Signed   By: Lavonia Dana M.D.   On: June 14, 2015 18:30   US Pelvis Complete  06/14/2015  CLINICAL DATA:  Fibroid uterus EXAM: TRANSABDOMINAL AND TRANSVAGINAL  ULTRASOUND OF PELVIS TECHNIQUE: Both transabdominal and transvaginal ultrasound examinations of the pelvis were performed. Transabdominal technique was performed for global imaging of the pelvis including uterus, ovaries, adnexal regions, and pelvic cul-de-sac. It was necessary to proceed with endovaginal exam following the transabdominal exam to visualize the endometrium and ovaries. COMPARISON:  None FINDINGS: Uterus Measurements: Prominent size, 20.0 cm length by 9.5 cm AP x 13.5 cm transverse. Markedly heterogeneous and nodular appearing myometrium compatible with multiple leiomyomata as seen on CT. Multiple shadowing calcifications. Large mass at fundus 10.2 x 6.6 x 11.8 cm containing calcifications with shadowing, minimal fluid and probable foci of gas. Additional large solid-appearing leiomyoma at lower uterine segment 9.9 x 7.6 x 10.0 cm, also corresponding to leiomyoma on CT. Endometrium Thickness: Not visualized, obscured by the numerous uterine masses identified. Right ovary Not visualized likely due to markedly enlarged  uterus, displacement, and obscuration by bowel and uterine calcifications. Left ovary Not visualized likely due to markedly enlarged uterus, displacement and obscuration by bowel and uterine calcifications. Other findings No free fluid. IMPRESSION: Markedly enlarged nodular uterus containing multiple uterine leiomyomata, some which are calcified, others of which are solid or necrotic, overall better visualized on prior CT. Unable to exclude malignant degeneration of leiomyomata by ultrasound; if further uterine evaluation is required, MR imaging with and without contrast would be the preferred modality. Electronically Signed   By: Lavonia Dana M.D.   On: 05/25/2015 18:30   Dg Esophagus  05/27/2015  CLINICAL DATA:  69 year old female with dysphagia and thrush. Subsequent encounter. EXAM: ESOPHOGRAM/BARIUM SWALLOW TECHNIQUE: Combined double contrast and single contrast examination performed using effervescent crystals, thick barium liquid, and thin barium liquid. FLUOROSCOPY TIME:  Radiation Exposure Index (as provided by the fluoroscopic device): 31.2 micro Gray Fluoroscopy Time:  3 minutes and 8 seconds. COMPARISON:  Report of recent swallow function study available. FINDINGS: When the patient ingested a 13 mm barium tablet, this became lodged in the lower cervical esophagus where there is smooth narrowing. Barium tablet cleared when it was partially dissolved. Normal primary esophageal stripping wave. No focal mucosal abnormality noted. Mild spontaneous gastroesophageal reflux into the region of the distal esophagus. Very small sliding-type hiatal hernia. IMPRESSION: 13 mm barium tablet became lodged in the lower cervical esophagus where there is smooth narrowing. Barium tablet only cleared when it was partially dissolved. Normal primary esophageal stripping wave. No focal mucosal abnormality noted. Mild spontaneous gastroesophageal reflux into the region of the distal esophagus. Very small sliding-type hiatal  hernia. Electronically Signed   By: Genia Del M.D.   On: 05/27/2015 11:15    Scheduled Meds: . antiseptic oral rinse  7 mL Mouth Rinse q12n4p  . chlorhexidine  15 mL Mouth Rinse BID  . docusate sodium  100 mg Oral BID  . feeding supplement (ENSURE ENLIVE)  237 mL Oral BID BM  . fluconazole (DIFLUCAN) IV  400 mg Intravenous Q24H  . folic acid  1 mg Oral Daily  . nystatin  5 mL Oral BID  . senna  1 tablet Oral BID  . sodium chloride  3 mL Intravenous Q12H  . thiamine  100 mg Oral Daily   Continuous Infusions:  Antibiotics Given (last 72 hours)    Date/Time Action Medication Dose Rate   05/25/15 2249 Given   cefTRIAXone (ROCEPHIN) 1 g in dextrose 5 % 50 mL IVPB 1 g 100 mL/hr   05/26/15 2246 Given   cefTRIAXone (ROCEPHIN) 1 g in dextrose 5 % 50 mL IVPB 1 g  100 mL/hr      Active Problems:   Symptomatic anemia   Leucocytosis   UTI (lower urinary tract infection)   Hyponatremia   Dysphagia   Thrush   Abdominal mass, RLQ (right lower quadrant)   Abnormal weight loss   Dehydration   Schistocytes on peripheral blood smear   Breast nodule    Time spent: 39min    [facility] Pager [phone]. If 7PM-7AM, please contact night-coverage at [email or web address], password [facility] 05/27/2015, 5:54 PM  LOS: 3 days  "The rings associated with iron deficiency

## 2015-05-28 ENCOUNTER — Telehealth: Payer: Self-pay | Admitting: Oncology

## 2015-05-28 LAB — CBC
HEMATOCRIT: 24.3 % — AB (ref 36.0–46.0)
HEMOGLOBIN: 7.5 g/dL — AB (ref 12.0–15.0)
MCH: 21.6 pg — ABNORMAL LOW (ref 26.0–34.0)
MCHC: 30.9 g/dL (ref 30.0–36.0)
MCV: 70 fL — AB (ref 78.0–100.0)
Platelets: 534 10*3/uL — ABNORMAL HIGH (ref 150–400)
RBC: 3.47 MIL/uL — AB (ref 3.87–5.11)
RDW: 25.6 % — ABNORMAL HIGH (ref 11.5–15.5)
WBC: 27.6 10*3/uL — AB (ref 4.0–10.5)

## 2015-05-28 LAB — TYPE AND SCREEN
ABO/RH(D): O POS
ANTIBODY SCREEN: NEGATIVE
UNIT DIVISION: 0
UNIT DIVISION: 0
Unit division: 0

## 2015-05-28 LAB — HEMOGLOBIN AND HEMATOCRIT, BLOOD
HCT: 33.3 % — ABNORMAL LOW (ref 36.0–46.0)
Hemoglobin: 10.5 g/dL — ABNORMAL LOW (ref 12.0–15.0)

## 2015-05-28 LAB — PREPARE RBC (CROSSMATCH)

## 2015-05-28 LAB — BASIC METABOLIC PANEL
ANION GAP: 8 (ref 5–15)
BUN: 7 mg/dL (ref 6–20)
CHLORIDE: 103 mmol/L (ref 101–111)
CO2: 26 mmol/L (ref 22–32)
Calcium: 8.6 mg/dL — ABNORMAL LOW (ref 8.9–10.3)
Creatinine, Ser: 0.41 mg/dL — ABNORMAL LOW (ref 0.44–1.00)
GFR calc non Af Amer: >60 mL/min (ref >60)
GLUCOSE: 100 mg/dL — AB (ref 65–99)
POTASSIUM: 3.5 mmol/L (ref 3.5–5.1)
Sodium: 137 mmol/L (ref 135–145)

## 2015-05-28 LAB — PATHOLOGIST SMEAR REVIEW

## 2015-05-28 MED ORDER — FERROUS FUMARATE 90 MG PO TABS: 1.0000 | ORAL_TABLET | Freq: Two times a day (BID) | ORAL | Status: DC

## 2015-05-28 MED ORDER — PANTOPRAZOLE SODIUM 40 MG PO TBEC
40.0000 mg | DELAYED_RELEASE_TABLET | Freq: Every day | ORAL | Status: DC
  Administered 2015-05-28 – 2015-05-29 (×2): 40 mg via ORAL
  Filled 2015-05-28 (×2): qty 1

## 2015-05-28 MED ORDER — FUROSEMIDE 20 MG PO TABS: 20.0000 mg | ORAL_TABLET | Freq: Every day | ORAL | Status: DC

## 2015-05-28 MED ORDER — PANTOPRAZOLE SODIUM 40 MG PO TBEC: 40.0000 mg | DELAYED_RELEASE_TABLET | Freq: Every day | ORAL | Status: DC

## 2015-05-28 MED ORDER — FLUCONAZOLE 100 MG PO TABS: 100.0000 mg | ORAL_TABLET | Freq: Every day | ORAL | Status: DC

## 2015-05-28 MED ORDER — SODIUM CHLORIDE 0.9 % IV SOLN: Freq: Once | INTRAVENOUS | Status: DC

## 2015-05-28 MED ORDER — FUROSEMIDE 10 MG/ML IJ SOLN
20.0000 mg | Freq: Once | INTRAMUSCULAR | Status: AC
  Administered 2015-05-28: 20 mg via INTRAVENOUS
  Filled 2015-05-28: qty 2

## 2015-05-28 MED ORDER — FLUCONAZOLE 100 MG PO TABS
100.0000 mg | ORAL_TABLET | Freq: Every day | ORAL | Status: DC
  Administered 2015-05-28 – 2015-05-29 (×2): 100 mg via ORAL
  Filled 2015-05-28 (×2): qty 1

## 2015-05-28 NOTE — Progress Notes (Signed)
Spoke with pt concerning PCP.  Pt states, "I just went to [facility], and they are the ones that found my low blood count and sent me here.  I want to keep [facility] and will continue to follow up with them."

## 2015-05-28 NOTE — Progress Notes (Signed)
Nutrition Follow-up  DOCUMENTATION CODES:   Severe malnutrition in context of acute illness/injury  INTERVENTION:  - Continue Ensure Enlive BID - Encourage PO intakes of meals and supplements - RD will continue to monitor for needs  NUTRITION DIAGNOSIS:   Malnutrition related to acute illness as evidenced by percent weight loss, energy intake < or equal to 50% for > or equal to 5 days, moderate depletion of body fat, moderate depletions of muscle mass. -ongoing  GOAL:   Patient will meet greater than or equal to 90% of their needs -variably met  MONITOR:   PO intake, Supplement acceptance, Labs, Weight trends, Skin, I & O's  REASON FOR ASSESSMENT:   Consult Assessment of nutrition requirement/status (and diet education)  ASSESSMENT:   Patient have been gradually getting more short of breath over past month and has some fatigue. She reports having trouble swallowing due to food getting stuck in her throat. She reports ankle swelling since October that has getting worse. She denies Pica, no melena, no blood in stools, no vaginal bleeding. She never had colonoscopy or mammogram due to lack of medical care. Reports area that she lives in hard to get a PCP. She reports loss of energy Since November. For the past months she has sensation of choking on food. She thinks she lost over 20 Lb since November.  12/20 New consult received. Per chart review, pt ate 100% of lunch and dinner yesterday (12/19) and 100% of breakfast this AM which she reports consisted of raisin bran with milk, fruit cocktail, a banana, and coffee. Pt reports slight improvement in swallowing with slight improvement in thrush on medication for the same. She also states that PTA her tastes were "dulled" but that this AM it seemed that she was able to taste foods slightly better than PTA. SLP saw pt yesterday and pt was d/c'ed from SLP service.  Pt states that she had esophagram yesterday and that it showed a large  pill stuck in esophageal region. She states that MD advised her not to consume bites of food larger than this pill. Pt states that she has been encouraged to consume low Na foods and increase protein intake. She has been drinking Ensure Enlive once/day without issue. Encouraged pt to increase to BID and to aim to eat 35 grams of protein between breakfast, lunch, and dinner (35 grams from 3 meals + 40 grams from Ensure Enlive BID = 75 grams of protein/day). Provided pt with "Protein Content of Foods" list/handout from the [employer]. Went over handout with pt and answered all questions.  Pt beginning to meet needs. Medications reviewed. Labs reviewed; creatinine low, Ca: 8.6 mg/dL, Alk Phos elevated.    12/18 - Pt reports around 20 lb weight loss since November when her swallowing issues began.  - Pt developed thrush during this time as well.  - Pt reports food getting stuck in her throat and then she started having issues with liquids as well.  - SLP evaluated pt and recommends regular diet and thin liquids with mild aspiration precautions. - Pt states she is to have another swallow eval tomorrow.  - RD to order pt Ensure supplements.  - Pt has lost 25 lb since November (17% weight loss x 1.5 months, significant for time frame). - Nutrition-Focused physical exam completed. Findings are moderate fat depletion, moderate muscle depletion, and moderate edema.    Diet Order:  Diet regular Room service appropriate?: Yes; Fluid consistency:: Thin  Skin:  Reviewed,  no issues  Last BM:  12/19  Height:   Ht Readings from Last 1 Encounters:  05/24/15 5' (1.524 m)    Weight:   Wt Readings from Last 1 Encounters:  05/24/15 123 lb 0.3 oz (55.8 kg)    Ideal Body Weight:  45.5 kg  BMI:  Body mass index is 24.03 kg/(m^2).  Estimated Nutritional Needs:   Kcal:  1650-1850  Protein:  75-85g  Fluid:  1.8L/day  EDUCATION NEEDS:   Education needs  addressed     Jarome Matin, RD, LDN Inpatient Clinical Dietitian Pager # [phone] After hours/weekend pager # [phone]
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
## 2015-05-28 NOTE — Telephone Encounter (Signed)
Staff message forwarded to Tiffany in HIM re request for new pt appt from [facility] per 12/19 pof - hosp referral.

## 2015-05-29 ENCOUNTER — Telehealth: Payer: Self-pay | Admitting: Oncology

## 2015-05-29 LAB — CBC
HEMATOCRIT: 30 % — AB (ref 36.0–46.0)
Hemoglobin: 9.3 g/dL — ABNORMAL LOW (ref 12.0–15.0)
MCH: 22.7 pg — AB (ref 26.0–34.0)
MCHC: 31 g/dL (ref 30.0–36.0)
MCV: 73.2 fL — AB (ref 78.0–100.0)
Platelets: 529 10*3/uL — ABNORMAL HIGH (ref 150–400)
RBC: 4.1 MIL/uL (ref 3.87–5.11)
RDW: 26.8 % — AB (ref 11.5–15.5)
WBC: 29.3 10*3/uL — AB (ref 4.0–10.5)

## 2015-05-29 LAB — BASIC METABOLIC PANEL
ANION GAP: 9 (ref 5–15)
BUN: 8 mg/dL (ref 6–20)
CALCIUM: 8.9 mg/dL (ref 8.9–10.3)
CO2: 27 mmol/L (ref 22–32)
Chloride: 104 mmol/L (ref 101–111)
Creatinine, Ser: 0.51 mg/dL (ref 0.44–1.00)
GFR calc Af Amer: >60 mL/min (ref >60)
GFR calc non Af Amer: >60 mL/min (ref >60)
GLUCOSE: 139 mg/dL — AB (ref 65–99)
POTASSIUM: 3.2 mmol/L — AB (ref 3.5–5.1)
Sodium: 140 mmol/L (ref 135–145)

## 2015-05-29 LAB — TYPE AND SCREEN
ABO/RH(D): O POS
ANTIBODY SCREEN: NEGATIVE
UNIT DIVISION: 0

## 2015-05-29 LAB — CULTURE, BLOOD (ROUTINE X 2)
Culture: NO GROWTH
Culture: NO GROWTH

## 2015-05-29 NOTE — Telephone Encounter (Signed)
new patient appt-RM# [id number] ss/w patient and gave np appt for 01/06 @ 10 w/labs @ 9:30.  Appt confirmed.

## 2015-05-29 NOTE — Progress Notes (Signed)
Patient reviewed Tired from getting up to go to RR 2/2 to lasix Still having some heavy bleeding No other concerns really Long discussion with patient about HR and its relation to anemia She is not dizzy nor lightheaded and has no cp but a little LE swelling She is stable to go home and already has a close follow-up as per Dr. Moody Bruins, MD [facility] (P) [phone]

## 2015-05-30 ENCOUNTER — Encounter: Payer: Self-pay | Admitting: Obstetrics & Gynecology

## 2015-05-30 ENCOUNTER — Ambulatory Visit (INDEPENDENT_AMBULATORY_CARE_PROVIDER_SITE_OTHER): Payer: Medicare Other | Admitting: Obstetrics & Gynecology

## 2015-05-30 ENCOUNTER — Other Ambulatory Visit (HOSPITAL_COMMUNITY)
Admission: RE | Admit: 2015-05-30 | Discharge: 2015-05-30 | Disposition: A | Discharge status: Home / Self Care | Payer: Medicare Other | Source: Ambulatory Visit | Attending: Obstetrics & Gynecology | Admitting: Obstetrics & Gynecology

## 2015-05-30 VITALS — BP 137/60 | HR 101 | Temp 98.4°F | Ht 61.0 in | Wt 120.1 lb

## 2015-05-30 DIAGNOSIS — D259 Leiomyoma of uterus, unspecified: Secondary | ICD-10-CM | POA: Diagnosis present

## 2015-05-30 DIAGNOSIS — N889 Noninflammatory disorder of cervix uteri, unspecified: Secondary | ICD-10-CM | POA: Diagnosis present

## 2015-05-30 MED ORDER — MEGESTROL ACETATE 40 MG PO TABS: 80.0000 mg | ORAL_TABLET | Freq: Two times a day (BID) | ORAL | Status: DC

## 2015-05-30 NOTE — Patient Instructions (Signed)
Uterine Fibroids Uterine fibroids are tissue masses (tumors) that can develop in the womb (uterus). They are also called leiomyomas. This type of tumor is not cancerous (benign) and does not spread to other parts of the body outside of the pelvic area, which is between the hip bones. Occasionally, fibroids may develop in the fallopian tubes, in the cervix, or on the support structures (ligaments) that surround the uterus. You can have one or many fibroids. Fibroids can vary in size, weight, and where they grow in the uterus. Some can become quite large. Most fibroids do not require medical treatment. CAUSES A fibroid can develop when a single uterine cell keeps growing (replicating). Most cells in the human body have a control mechanism that keeps them from replicating without control. SIGNS AND SYMPTOMS Symptoms may include:   Heavy bleeding during your period.  Bleeding or spotting between periods.  Pelvic pain and pressure.  Bladder problems, such as needing to urinate more often (urinary frequency) or urgently.  Inability to reproduce offspring (infertility).  Miscarriages. DIAGNOSIS Uterine fibroids are diagnosed through a physical exam. Your health care provider may feel the lumpy tumors during a pelvic exam. Ultrasonography and an MRI may be done to determine the size, location, and number of fibroids. TREATMENT Treatment may include:  Watchful waiting. This involves getting the fibroid checked by your health care provider to see if it grows or shrinks. Follow your health care provider's recommendations for how often to have this checked.  Hormone medicines. These can be taken by mouth or given through an intrauterine device (IUD).  Surgery.  Removing the fibroids (myomectomy) or the uterus (hysterectomy).  Removing blood supply to the fibroids (uterine artery embolization). If fibroids interfere with your fertility and you want to become pregnant, your health care provider  may recommend having the fibroids removed.  HOME CARE INSTRUCTIONS  Keep all follow-up visits as directed by your health care provider. This is important.  Take medicines only as directed by your health care provider.  If you were prescribed a hormone treatment, take the hormone medicines exactly as directed.  Do not take aspirin, because it can cause bleeding.  Ask your health care provider about taking iron pills and increasing the amount of dark green, leafy vegetables in your diet. These actions can help to boost your blood iron levels, which may be affected by heavy menstrual bleeding.  Pay close attention to your period and tell your health care provider about any changes, such as:  Increased blood flow that requires you to use more pads or tampons than usual per month.  A change in the number of days that your period lasts per month.  A change in symptoms that are associated with your period, such as abdominal cramping or back pain. SEEK MEDICAL CARE IF:  You have pelvic pain, back pain, or abdominal cramps that cannot be controlled with medicines.  You have an increase in bleeding between and during periods.  You soak tampons or pads in a half hour or less.  You feel lightheaded, extra tired, or weak. SEEK IMMEDIATE MEDICAL CARE IF:  You faint.  You have a sudden increase in pelvic pain.   This information is not intended to replace advice given to you by your health care provider. Make sure you discuss any questions you have with your health care provider.   Document Released: 05/22/2000 Document Revised: 06/15/2014 Document Reviewed: 11/21/2013 Elsevier Interactive Patient Education 2016 [employer]vier Inc.  

## 2015-05-30 NOTE — Progress Notes (Signed)
Scheduled pt with GYN/Oncology for December 29th @ 0915 with Dr. Everitt Amber.  Called pt and LM to return call concerning appt.

## 2015-05-30 NOTE — Progress Notes (Signed)
Patient ID: Cindy Anderson, female   DOB: 1945-10-01, 69 y.o.   MRN: [id number]  Chief Complaint  Patient presents with  . Fibroids  vaginal bleeding for 3 months, anemia  HPI Cindy Anderson is a 69 y.o. female.  G1P1001 Postmenopausal at age 60, with vaginal bleeding occasionally heavy for 3 months. She was admitted to to [facility] 05/24/15 with weakness and anemia, Hb 6.1. Imaging showed large degenerating uterine masses. She received 3 units of PRBC.  HPI Patient Active Problem List   Diagnosis Date Noted  . Symptomatic anemia 05/24/2015  . Leucocytosis 05/24/2015  . UTI (lower urinary tract infection) 05/24/2015  . Hyponatremia 05/24/2015  . Dysphagia 05/24/2015  . Thrush 05/24/2015  . Schistocytes on peripheral blood smear 05/24/2015  . Breast nodule 05/24/2015  . Abdominal mass, RLQ (right lower quadrant)   . Abnormal weight loss   . Dehydration     No past medical history on file.  No past surgical history on file.  Family History  Problem Relation Age of Onset  . Hypertension Other   . Dementia Mother   . Hypertension Mother   . Hypertension Father   . CAD Father   . Breast cancer Sister     Social History Social History  Substance Use Topics  . Smoking status: Never Smoker   . Smokeless tobacco: None  . Alcohol Use: No    No Known Allergies  Current Outpatient Prescriptions  Medication Sig Dispense Refill  . Ferrous Fumarate 90 MG TABS Take 1 tablet (90 mg total) by mouth 2 (two) times daily with a meal. 60 each 0  . fluconazole (DIFLUCAN) 100 MG tablet Take 1 tablet (100 mg total) by mouth daily. 30 tablet 0  . furosemide (LASIX) 20 MG tablet Take 1 tablet (20 mg total) by mouth daily. 30 tablet 0  . Multiple Vitamins-Minerals (MULTIVITAMIN & MINERAL PO) Take 1 tablet by mouth daily.    . pantoprazole (PROTONIX) 40 MG tablet Take 1 tablet (40 mg total) by mouth daily at 12 noon. 30 tablet 0  . vitamin C (ASCORBIC ACID) 500 MG tablet Take 500 mg by mouth daily.     . megestrol (MEGACE) 40 MG tablet Take 2 tablets (80 mg total) by mouth 2 (two) times daily. 60 tablet 3   No current facility-administered medications for this visit.    Review of Systems Review of Systems  Constitutional: Positive for activity change and fatigue.  Gastrointestinal: Positive for abdominal distention.  Genitourinary: Positive for vaginal bleeding, vaginal discharge and pelvic pain.    Blood pressure 137/60, pulse 101, temperature 98.4 F (36.9 C), temperature source Oral, height 5\' 1"  (1.549 m), weight 120 lb 1.6 oz (54.477 kg).  Physical Exam Physical Exam  Constitutional: She is oriented to person, place, and time. No distress.  Thin  Pulmonary/Chest: Effort normal.  Abdominal: Soft. She exhibits mass (firm mass to umbilicus).  Genitourinary: Vaginal discharge (grey, brown foul smell) found.  Necrotic mass prolapsed approx 7 cm, biopsied X3 after time out and consent signed. Uterine mass 18-20 weeks  Neurological: She is alert and oriented to person, place, and time.  Skin: Skin is warm and dry.  Psychiatric: She has a normal mood and affect.    Data Reviewed CLINICAL DATA: Weakness and shortness of breath.  EXAM: CT CHEST, ABDOMEN, AND PELVIS WITH CONTRAST  TECHNIQUE: Multidetector CT imaging of the chest, abdomen and pelvis was performed following the standard protocol during bolus administration of intravenous contrast.  CONTRAST: 65mL OMNIPAQUE  IOHEXOL 300 MG/ML SOLN, 168mL OMNIPAQUE IOHEXOL 300 MG/ML SOLN  COMPARISON: None.  FINDINGS: CT CHEST FINDINGS  Mediastinum/Nodes: Normal heart size. Aortic atherosclerosis noted. Calcification within the LAD and left circumflex coronary artery noted. The trachea appears patent and is midline. Normal appearance of the esophagus. No supraclavicular or axillary adenopathy. There is no mediastinal or hilar adenopathy.  Lungs/Pleura: No pleural fluid identified. No airspace consolidation or  atelectasis present. No suspicious nodules or mass is identified within the lungs.  Musculoskeletal: No aggressive lytic or sclerotic bone lesions. Mild curvature of the thoracic spine is noted. The thoracic vertebral bodies are well preserved. Mild degenerative disc disease noted. No aggressive lytic or sclerotic bone lesions identified.  CT ABDOMEN PELVIS FINDINGS  Hepatobiliary: No suspicious liver abnormalities identified. The gallbladder appears normal. There is no biliary dilatation.  Pancreas: The pancreas is normal.  Spleen: Negative.  Adrenals/Urinary Tract: The adrenal glands are negative. Normal appearance of the kidneys. Urinary bladder appears within normal limits.  Stomach/Bowel: The stomach appears normal. The small bowel loops have a normal caliber. There is no pathologic dilatation of the large or small bowel loops.  Vascular/Lymphatic: Calcified atherosclerotic disease involves the abdominal aorta. No aneurysm. No enlarged retroperitoneal or mesenteric adenopathy. No enlarged pelvic or inguinal lymph nodes.  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 There are multiple fibroids scratch set multiple calcified and noncalcified fibroids are identified. The uterus measures 15.5 x 13.2 by 17.7 cm. The largest fibroid arises from the uterine fundus and measures 9.5 cm. This appears to of undergone central degeneration. There is gas within this degenerating fibroid, image number 28 of series 3. Within the lower uterine segment there is a large fibroid measuring 10.5 x 10.7 x 11.0 cm. This also appears to contain multiple foci of gas.  Other: No free fluid identified. No abnormal fluid collections identified.  Musculoskeletal: No aggressive lytic or sclerotic bone lesion identified.  IMPRESSION: 1. No acute findings identified within the abdomen or pelvis. 2. Markedly enlarged fibroid uterus. Several of these fibroids  have undergone internal degeneration and now contain gas. In a patient who has anemia and abnormal weight loss the possibility of malignant degeneration of 1 or more of these fibroids should be considered. No findings to suggest metastatic disease. 3. Aortic atherosclerosis.   Electronically Signed  By: Kerby Moors M.D.  On: 05/24/2015 21:09 Hospital notes CBC    Component Value Date/Time   WBC 29.3* 05/29/2015 0440   RBC 4.10 05/29/2015 0440   RBC 3.24* 05/24/2015 1935   HGB 9.3* 05/29/2015 0440   HCT 30.0* 05/29/2015 0440   PLT 529* 05/29/2015 0440   MCV 73.2* 05/29/2015 0440   MCH 22.7* 05/29/2015 0440   MCHC 31.0 05/29/2015 0440   RDW 26.8* 05/29/2015 0440   LYMPHSABS 1.9 05/26/2015 0516   MONOABS 1.6* 05/26/2015 0516   EOSABS 0.0 05/26/2015 0516   BASOSABS 0.0 05/26/2015 0516    BMET    Component Value Date/Time   NA 140 05/29/2015 0440   K 3.2* 05/29/2015 0440   CL 104 05/29/2015 0440   CO2 27 05/29/2015 0440   GLUCOSE 139* 05/29/2015 0440   BUN 8 05/29/2015 0440   CREATININE 0.51 05/29/2015 0440   CALCIUM 8.9 05/29/2015 0440   GFRNONAA >60 05/29/2015 0440   GFRAA >60 05/29/2015 0440      Assessment    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

## 2015-05-31 ENCOUNTER — Telehealth: Payer: Self-pay

## 2015-05-31 NOTE — Telephone Encounter (Signed)
Notified pt of GYN oncology appt with Dr. Denman George scheduled for December 29th @ 0915.  I advised pt to arrive 20 minutes before appt and that her appt is @ [facility] which  they have [address] parking.  I also informed pt that there will be information sent to her address.  Pt stated understanding with further questions.

## 2015-06-04 LAB — BCR-ABL1, CML/ALL, PCR, QUANT

## 2015-06-06 ENCOUNTER — Encounter: Payer: Self-pay | Admitting: Gynecologic Oncology

## 2015-06-06 ENCOUNTER — Ambulatory Visit: Payer: Medicare Other | Attending: Gynecologic Oncology | Admitting: Gynecologic Oncology

## 2015-06-06 VITALS — BP 148/64 | HR 120 | Temp 98.3°F | Resp 18 | Ht 61.0 in | Wt 115.3 lb

## 2015-06-06 DIAGNOSIS — D649 Anemia, unspecified: Secondary | ICD-10-CM | POA: Diagnosis not present

## 2015-06-06 DIAGNOSIS — N9489 Other specified conditions associated with female genital organs and menstrual cycle: Secondary | ICD-10-CM | POA: Diagnosis present

## 2015-06-06 DIAGNOSIS — E43 Unspecified severe protein-calorie malnutrition: Secondary | ICD-10-CM | POA: Diagnosis not present

## 2015-06-06 DIAGNOSIS — N858 Other specified noninflammatory disorders of uterus: Secondary | ICD-10-CM

## 2015-06-06 NOTE — Progress Notes (Signed)
Consult Note: Gyn-Onc  Consult was requested by Dr. Roselie Awkward for the evaluation of Cindy Anderson 69 y.o. female with a necrotic uterine mass  CC:  Chief Complaint  Patient presents with  . Uterine mass    New Consult    Assessment/Plan:  Cindy Anderson  is a 69 y.o.  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  It is very necrotic and symptomatic from bleeding and profuse discharge, and is operable based on my exam, though I explained to the patient that surgery alone is likely to not be curative, and surgery would require a very radical procedure, particularly in this patient who is profoundly nutritionally depleted and debilitated.  I discussed that surgery would involve an exploratory laparotomy, TAH, BSO, radical tumor debulking. Of note, the CT imaging does not show gross extrauterine disease, however I discussed with the patient that these tumors can infiltrate local tissues and necessitate resection of GI or GU organs in order to resect them. I discussed that surgery of this nature is associated with high morbidity, including bleeding, infection, damage to internal organs, reoperation, VTE event and death. I discussed that she is at increased risks for these complications due to her malnutrition and debilitated state.  Anemia : likely secondary to blood loss from bleeding uterine tumor, and chronic disease from malignancy. Given the anticipated blood loss from her surgery I am recommending that she be transfused preoperatively and counseled the patient that she will likely require additional transfusions perioperatively.   Severe malnutrition: counseled patient on the importance of maximizing protein intake with meals and to consume 2 ensure drinks per day. I discussed malnutrition is associated with wound infection and wound non-healing risks.  General debilitation: she is at increased risk for requiring admission to a nursing home or SNF postop.  Advanced (locally)  malignancy: I discussed that she will likely require adjuvant therapies (such as radiation +/- chemotherapy) with specifics to be determined based on her final pathology and surgical findings (and her recovery from surgery). I discussed that surgery is unlikely to be curative in and of itself.  Given the symptomatic nature of her disease, we are expediting her surgical date by having her procedure performed by my partner, Dr Fermin Schwab in [place].   HPI: Cindy Anderson is a 69 year old G1P1 who is seen in consultation at the request of Dr Roselie Awkward for a uterine mass, bleeding and suspicion for malignancy.    The patient has had no medical care for approximately 30 years. She began feeling weak and debilitated in November 2016. Prior to that, in the fall 2016 she had noted a vaginal discharge that was becoming increased in volume and malodorous. In November 2016 she began experiencing vaginal bleeding. The bleeding and discharge became progressively worse as stated her fatigue and debilitation and she was admitted to [facility] in [place] on 05/24/2015. While there she was noted to be severely weak and anemic with a hemoglobin of 6.1. She received 3 units of packed red blood cell transfusion. CT imaging of the chest abdomen and pelvis revealed no chest abnormalities, no upper abdominal abnormalities, but a markedly enlarged fibroid uterus was identified. There were multiple fibroids that included calcifications. The uterus measured 15.5 x 13.2 x 17.7 cm. The largest fibroid measured 9.5 cm and within the lower uterine segment there was a fibroid measuring 10.5 x 11 cm containing multiple foci of gas.  She was eventually discharged and seen by Dr. Roselie Awkward on 06/05/2015.  At that time the pelvic examination revealed a necrotic mass prolapsing into the vagina. This mass was biopsied by Dr. Roselie Awkward. I have spoken to the pathologist regarding this result. It does not definitively malignancy is is  mostly necrotic tissue however they favor malignancy, but cannot elucidate the specific cell type.  Cindy Anderson reports that she has early satiety and anorexia. She eats very little protein. She has had profound 30 pound weight loss in a 6 month period. She is weak at home and struggling with activities of daily living.   She reports no medical care for approximately 30 years and therefore does not have any specified past medical history and has not had prior surgeries. She lives with her husband.  Current Meds:  Outpatient Encounter Prescriptions as of 06/06/2015  Medication Sig  . acetaminophen (TYLENOL) 500 MG tablet Take 500 mg by mouth every 6 (six) hours as needed.  . fluconazole (DIFLUCAN) 100 MG tablet Take 1 tablet (100 mg total) by mouth daily.  . Iron Combinations (IRON COMPLEX PO) Take 27 mg by mouth every morning.  . megestrol (MEGACE) 40 MG tablet Take 2 tablets (80 mg total) by mouth 2 (two) times daily.  . Multiple Vitamins-Minerals (MULTIVITAMIN & MINERAL PO) Take 1 tablet by mouth daily.  . pantoprazole (PROTONIX) 40 MG tablet Take 1 tablet (40 mg total) by mouth daily at 12 noon.  . vitamin C (ASCORBIC ACID) 500 MG tablet Take 500 mg by mouth daily.  . furosemide (LASIX) 20 MG tablet Take 1 tablet (20 mg total) by mouth daily. (Patient not taking: Reported on 06/06/2015)  . [DISCONTINUED] Ferrous Fumarate 90 MG TABS Take 1 tablet (90 mg total) by mouth 2 (two) times daily with a meal.   No facility-administered encounter medications on file as of 06/06/2015.    Allergy: No Known Allergies  Social Hx:   Social History   Social History  . Marital Status: Married    Spouse Name: N/A  . Number of Children: N/A  . Years of Education: N/A   Occupational History  . Not on file.   Social History Main Topics  . Smoking status: Never Smoker   . Smokeless tobacco: Not on file  . Alcohol Use: No  . Drug Use: No  . Sexual Activity: Not on file   Other Topics Concern  .  Not on file   Social History Narrative    Past Surgical Hx: History reviewed. No pertinent past surgical history.  Past Medical Hx: History reviewed. No pertinent past medical history.  Past Gynecological History:  G1P1 (SVD). Known history of asymptomatic uterine fibroids (unclear of size historically).  No LMP recorded. Patient is postmenopausal.  Family Hx:  Family History  Problem Relation Age of Onset  . Hypertension Other   . Dementia Mother   . Hypertension Mother   . Hypertension Father   . CAD Father   . Breast cancer Sister     Review of Systems:  Constitutional  Feels generally unwell and weak ENT Normal appearing ears and nares bilaterally Skin/Breast  No rash, sores, jaundice, itching, dryness Cardiovascular  No chest pain, shortness of breath, or edema  Pulmonary  No cough or wheeze.  Gastro Intestinal  Intermittent constipation. No nausea or emesis. Passes flatus consistently Genito Urinary  + urinary frequency. + profuse brown vaginal discharge that is malodorous and vaginal bleeding. Musculo Skeletal  No myalgia, arthralgia, joint swelling or pain  Neurologic  + generalized weakness Psychology  No depression, anxiety, insomnia.  Vitals:  Blood pressure 148/64, pulse 120, temperature 98.3 F (36.8 C), temperature source Oral, resp. rate 18, height 5\' 1"  (1.549 m), weight 115 lb 4.8 oz (52.3 kg), SpO2 99 %.  Physical Exam: Temporal wasting evident Neck  Supple NROM, without any enlargements.  Lymph Node Survey No cervical supraclavicular or inguinal adenopathy Cardiovascular  Pulse normal rate, regularity and rhythm. S1 and S2 normal.  Lungs  Clear to auscultation bilateraly, without wheezes/crackles/rhonchi. Good air movement.  Skin  No rash/lesions/breakdown  Psychiatry  Alert and oriented to person, place, and time  Abdomen  Distended lower abdomen with mass that is not mobile extending to umbilicus Back No CVA tenderness Genito  Urinary  Vulva/vagina: Normal external female genitalia.  No lesions. No discharge or bleeding.  Bladder/urethra:  No lesions or masses, well supported bladder  Vagina: filled with necrotic friable 10cm mass and foul smelling brown liquid mucoid discharge and fresh blood.  Cervix: thin rim of normal cervix felt palpable around the prolapsing mass.  Uterus: indistinguishable from the large, minimally mobile central pelvic mass with prolapse of necrotic tissue into vagina.  Adnexa: unable to appreciate distinct adnexal masses Rectal  Large mass filling posterior cul de sac, somewhat smooth, compressing rectum but not apparently infiltrating or involving rectum Extremities  No bilateral cyanosis, clubbing or edema.   Donaciano Eva, MD  06/06/2015, 10:20 AM

## 2015-06-06 NOTE — Patient Instructions (Signed)
Plan for surgery with Dr. Fermin Schwab at [facility] on January 3.  Your pre-operative appointment will be tomorrow, Dec 30, at 2 pm at the [facility] on the [place].  Please call for any questions or concerns.

## 2015-06-10 ENCOUNTER — Other Ambulatory Visit: Payer: Self-pay | Admitting: Oncology

## 2015-06-10 DIAGNOSIS — C9210 Chronic myeloid leukemia, BCR/ABL-positive, not having achieved remission: Secondary | ICD-10-CM

## 2015-06-11 HISTORY — PX: TOTAL ABDOMINAL HYSTERECTOMY W/ BILATERAL SALPINGOOPHORECTOMY: SHX83

## 2015-06-14 ENCOUNTER — Ambulatory Visit (HOSPITAL_BASED_OUTPATIENT_CLINIC_OR_DEPARTMENT_OTHER): Payer: Federal, State, Local not specified - PPO | Admitting: Oncology

## 2015-06-14 ENCOUNTER — Other Ambulatory Visit: Payer: Self-pay | Admitting: *Deleted

## 2015-06-14 ENCOUNTER — Other Ambulatory Visit: Payer: Federal, State, Local not specified - PPO

## 2015-06-14 DIAGNOSIS — R1903 Right lower quadrant abdominal swelling, mass and lump: Secondary | ICD-10-CM

## 2015-06-14 NOTE — Progress Notes (Signed)
Mystic's was scheduled to see me today but she was sent to [facility] for surgery which she had January 3. Unfortunately we were not informed. We are canceling her appointment today and rescheduling for January 20.

## 2015-06-15 ENCOUNTER — Telehealth: Payer: Self-pay | Admitting: Oncology

## 2015-06-15 NOTE — Telephone Encounter (Signed)
Per 1st 1/6 pof r/s today appointment to 1/20 with lab same day, then per 2nd 1/6 pof correction do not r/s appointment to 1/20 and n\o labs that day. No action taken and both pof's removed from onbox.

## 2015-06-21 ENCOUNTER — Ambulatory Visit: Payer: Medicare Other | Attending: Gynecology | Admitting: Gynecology

## 2015-06-21 ENCOUNTER — Encounter: Payer: Self-pay | Admitting: Gynecology

## 2015-06-21 ENCOUNTER — Other Ambulatory Visit (HOSPITAL_BASED_OUTPATIENT_CLINIC_OR_DEPARTMENT_OTHER): Payer: Medicare Other

## 2015-06-21 ENCOUNTER — Other Ambulatory Visit: Payer: Self-pay | Admitting: Gynecologic Oncology

## 2015-06-21 VITALS — BP 150/91 | HR 97 | Temp 98.1°F | Resp 19

## 2015-06-21 DIAGNOSIS — N9489 Other specified conditions associated with female genital organs and menstrual cycle: Secondary | ICD-10-CM | POA: Insufficient documentation

## 2015-06-21 DIAGNOSIS — D649 Anemia, unspecified: Secondary | ICD-10-CM

## 2015-06-21 DIAGNOSIS — C541 Malignant neoplasm of endometrium: Secondary | ICD-10-CM

## 2015-06-21 DIAGNOSIS — L259 Unspecified contact dermatitis, unspecified cause: Secondary | ICD-10-CM | POA: Diagnosis present

## 2015-06-21 DIAGNOSIS — E43 Unspecified severe protein-calorie malnutrition: Secondary | ICD-10-CM | POA: Diagnosis not present

## 2015-06-21 DIAGNOSIS — R600 Localized edema: Secondary | ICD-10-CM | POA: Diagnosis not present

## 2015-06-21 DIAGNOSIS — D630 Anemia in neoplastic disease: Secondary | ICD-10-CM

## 2015-06-21 DIAGNOSIS — N858 Other specified noninflammatory disorders of uterus: Secondary | ICD-10-CM

## 2015-06-21 DIAGNOSIS — C9210 Chronic myeloid leukemia, BCR/ABL-positive, not having achieved remission: Secondary | ICD-10-CM | POA: Diagnosis not present

## 2015-06-21 LAB — CBC & DIFF AND RETIC
BASO%: 0.3 % (ref 0.0–2.0)
BASOS ABS: 0 10*3/uL (ref 0.0–0.1)
EOS%: 1.6 % (ref 0.0–7.0)
Eosinophils Absolute: 0.2 10*3/uL (ref 0.0–0.5)
HCT: 28.6 % — ABNORMAL LOW (ref 34.8–46.6)
HEMOGLOBIN: 9 g/dL — AB (ref 11.6–15.9)
LYMPH#: 1.2 10*3/uL (ref 0.9–3.3)
LYMPH%: 12.8 % — AB (ref 14.0–49.7)
MCH: 24.8 pg — ABNORMAL LOW (ref 25.1–34.0)
MCHC: 31.5 g/dL (ref 31.5–36.0)
MCV: 78.8 fL — AB (ref 79.5–101.0)
MONO#: 0.9 10*3/uL (ref 0.1–0.9)
MONO%: 9.2 % (ref 0.0–14.0)
NEUT#: 7.4 10*3/uL — ABNORMAL HIGH (ref 1.5–6.5)
NEUT%: 76.1 % (ref 38.4–76.8)
Platelets: 898 10*3/uL — ABNORMAL HIGH (ref 145–400)
RBC: 3.63 10*6/uL — ABNORMAL LOW (ref 3.70–5.45)
RDW: 25.3 % — ABNORMAL HIGH (ref 11.2–14.5)
WBC: 9.7 10*3/uL (ref 3.9–10.3)
nRBC: 0 % (ref 0–0)

## 2015-06-21 LAB — TECHNOLOGIST REVIEW

## 2015-06-21 LAB — FERRITIN: FERRITIN: 550 ng/mL — AB (ref 9–269)

## 2015-06-21 NOTE — Patient Instructions (Signed)
Take Aldactazide 1 tablet twice a day. Apply Neosporin twice a day to the shins.

## 2015-06-21 NOTE — Progress Notes (Signed)
Consult Note: Gyn-Onc   Cindy Anderson 70 y.o. female  Chief Complaint  Patient presents with  . uterine mass    follow up    Assessment : Contact dermatitis of the distal lower extremities. Lower extremity edema secondary to malnutrition.   Plan: Patient will apply cold compresses to her leg. She will apply Neosporin twice a day to the erythematous areas. She's given a prescription for Aldactazide 50/50 to be taken twice a day. She should keep her legs elevated.  CBC today shows further improvement with a white count down to 9.7 and platelet count of 898,000 (previously was in excess of 1 million.  Discussed with the patient and her husband the surgical pathologic findings and the recommendations and rationale for using combination chemotherapy and extended field radiation therapy. They would like to have the chemotherapy at [facility] and radiation therapy in [place].  Interval History: The patient presents today with a new onset of erythema of her shins bilaterally. This began yesterday. Overnight she's been using cool compresses. She denies any fever or chills. She continues to take antibiotics prescribed upon hospital discharge.  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 She underwent a total abdominal hysterectomy bilateral salpingo-oophorectomy, posterior vaginectomy, pelvic and aortic lymphadenectomy. She had deep myometrial invasion and involvement of pelvic and aortic lymph nodes as well as the posterior vagina. Her postoperative course was uncomplicated. Review of Systems:10 point review of systems is negative except as noted in interval history.   Vitals: Blood pressure 150/91, pulse 97, temperature 98.1 F (36.7 C), temperature source Oral, resp. rate 19, SpO2 98 %.  Physical Exam: General : The patient is a healthy woman in no acute distress.   Lower extremities:  Bilateral 3+ edema. The anterior shins there is erythema that appears to be contact dermatitis.     No Known Allergies  History reviewed. No pertinent past medical history.  History reviewed. No pertinent past surgical history.  Current Outpatient Prescriptions  Medication Sig Dispense Refill  . acetaminophen (TYLENOL) 325 MG tablet Take 650 mg by mouth.    . docusate sodium (COLACE) 100 MG capsule Take 100 mg by mouth.    . doxycycline (VIBRA-TABS) 100 MG tablet Take 100 mg by mouth.    . enoxaparin (LOVENOX) 40 MG/0.4ML injection Inject 40 mg into the skin.    . famotidine (PEPCID) 20 MG tablet Take 20 mg by mouth.    Marland Kitchen ibuprofen (ADVIL,MOTRIN) 600 MG tablet Take 600 mg by mouth.    . Iron Combinations (IRON COMPLEX PO) Take 27 mg by mouth every morning.    . megestrol (MEGACE) 40 MG tablet Take 2 tablets (80 mg total) by mouth 2 (two) times daily. 60 tablet 3  . metroNIDAZOLE (FLAGYL) 500 MG tablet Take 500 mg by mouth.    . nystatin (MYCOSTATIN) 100000 UNIT/ML suspension Take by mouth.    . oxyCODONE (OXY IR/ROXICODONE) 5 MG immediate release tablet Take 5 mg by mouth.    . polyethylene glycol (MIRALAX / GLYCOLAX) packet Take by mouth.    . senna (SENOKOT) 8.6 MG tablet Take by mouth.    Marland Kitchen UNABLE TO FIND Take by mouth.    . fluconazole (DIFLUCAN) 100 MG tablet Take 1 tablet (100 mg total) by mouth daily. (Patient not taking: Reported on 06/21/2015) 30 tablet 0  . furosemide (LASIX) 20 MG tablet Take 1 tablet (20 mg total) by  mouth daily. (Patient not taking: Reported on 06/06/2015) 30 tablet 0  . Multiple Vitamins-Minerals (CENTRUM SILVER) tablet Take by mouth.    . Multiple Vitamins-Minerals (MULTIVITAMIN & MINERAL PO) Take 1 tablet by mouth daily.    . pantoprazole (PROTONIX) 40 MG tablet Take 1 tablet (40 mg total) by mouth daily at 12 noon. 30 tablet 0  . vitamin C (ASCORBIC ACID) 500 MG tablet Take 500 mg by mouth daily.     No current facility-administered medications for  this visit.    Social History   Social History  . Marital Status: Married    Spouse Name: N/A  . Number of Children: N/A  . Years of Education: N/A   Occupational History  . Not on file.   Social History Main Topics  . Smoking status: Never Smoker   . Smokeless tobacco: Not on file  . Alcohol Use: No  . Drug Use: No  . Sexual Activity: Not on file   Other Topics Concern  . Not on file   Social History Narrative    Family History  Problem Relation Age of Onset  . Hypertension Other   . Dementia Mother   . Hypertension Mother   . Hypertension Father   . CAD Father   . Breast cancer Sister       Alvino Chapel, MD 06/21/2015, 11:12 AM       Consult Note: Gyn-Onc   Cindy Anderson 70 y.o. female  Chief Complaint  Patient presents with  . uterine mass    follow up    Assessment :  Plan:  Interval History:   HPI:  Review of Systems:10 point review of systems is negative except as noted in interval history.   Vitals: Blood pressure 150/91, pulse 97, temperature 98.1 F (36.7 C), temperature source Oral, resp. rate 19, SpO2 98 %.  Physical Exam: General : The patient is a healthy woman in no acute distress.  HEENT: normocephalic, extraoccular movements normal; neck is supple without thyromegally  Lynphnodes: Supraclavicular and inguinal nodes not enlarged  Abdomen: Soft, non-tender, no ascites, no organomegally, no masses, no hernias  Pelvic:  EGBUS: Normal female  Vagina: Normal, no lesions  Urethra and Bladder: Normal, non-tender  Cervix: Surgically absent  Uterus: Surgically absent  Bi-manual examination: Non-tender; no adenxal masses or nodularity  Rectal: normal sphincter tone, no masses, no blood  Lower extremities: No edema or varicosities. Normal range of motion      No Known Allergies  History reviewed. No pertinent past medical history.  History reviewed. No pertinent past surgical history.  Current Outpatient  Prescriptions  Medication Sig Dispense Refill  . acetaminophen (TYLENOL) 325 MG tablet Take 650 mg by mouth.    . docusate sodium (COLACE) 100 MG capsule Take 100 mg by mouth.    . doxycycline (VIBRA-TABS) 100 MG tablet Take 100 mg by mouth.    . enoxaparin (LOVENOX) 40 MG/0.4ML injection Inject 40 mg into the skin.    . famotidine (PEPCID) 20 MG tablet Take 20 mg by mouth.    Marland Kitchen ibuprofen (ADVIL,MOTRIN) 600 MG tablet Take 600 mg by mouth.    . Iron Combinations (IRON COMPLEX PO) Take 27 mg by mouth every morning.    . megestrol (MEGACE) 40 MG tablet Take 2 tablets (80 mg total) by mouth 2 (two) times daily. 60 tablet 3  . metroNIDAZOLE (FLAGYL) 500 MG tablet Take 500 mg by mouth.    . nystatin (MYCOSTATIN) 100000 UNIT/ML suspension Take by mouth.    Marland Kitchen  oxyCODONE (OXY IR/ROXICODONE) 5 MG immediate release tablet Take 5 mg by mouth.    . polyethylene glycol (MIRALAX / GLYCOLAX) packet Take by mouth.    . senna (SENOKOT) 8.6 MG tablet Take by mouth.    Marland Kitchen UNABLE TO FIND Take by mouth.    . fluconazole (DIFLUCAN) 100 MG tablet Take 1 tablet (100 mg total) by mouth daily. (Patient not taking: Reported on 06/21/2015) 30 tablet 0  . furosemide (LASIX) 20 MG tablet Take 1 tablet (20 mg total) by mouth daily. (Patient not taking: Reported on 06/06/2015) 30 tablet 0  . Multiple Vitamins-Minerals (CENTRUM SILVER) tablet Take by mouth.    . Multiple Vitamins-Minerals (MULTIVITAMIN & MINERAL PO) Take 1 tablet by mouth daily.    . pantoprazole (PROTONIX) 40 MG tablet Take 1 tablet (40 mg total) by mouth daily at 12 noon. 30 tablet 0  . vitamin C (ASCORBIC ACID) 500 MG tablet Take 500 mg by mouth daily.     No current facility-administered medications for this visit.    Social History   Social History  . Marital Status: Married    Spouse Name: N/A  . Number of Children: N/A  . Years of Education: N/A   Occupational History  . Not on file.   Social History Main Topics  . Smoking status: Never  Smoker   . Smokeless tobacco: Not on file  . Alcohol Use: No  . Drug Use: No  . Sexual Activity: Not on file   Other Topics Concern  . Not on file   Social History Narrative    Family History  Problem Relation Age of Onset  . Hypertension Other   . Dementia Mother   . Hypertension Mother   . Hypertension Father   . CAD Father   . Breast cancer Sister       Alvino Chapel, MD 06/21/2015, 11:12 AM

## 2015-06-22 LAB — RETICULOCYTES: Reticulocyte Count: 2.7 % — ABNORMAL HIGH (ref 0.6–2.6)

## 2015-06-24 ENCOUNTER — Other Ambulatory Visit: Payer: Self-pay | Admitting: Oncology

## 2015-06-26 ENCOUNTER — Other Ambulatory Visit: Payer: Self-pay | Admitting: Oncology

## 2015-07-08 ENCOUNTER — Other Ambulatory Visit: Payer: Self-pay | Admitting: Oncology

## 2015-08-07 ENCOUNTER — Encounter: Payer: Self-pay | Admitting: *Deleted

## 2015-08-30 ENCOUNTER — Other Ambulatory Visit: Payer: Self-pay | Admitting: Gynecologic Oncology

## 2015-08-30 DIAGNOSIS — C55 Malignant neoplasm of uterus, part unspecified: Secondary | ICD-10-CM

## 2015-09-11 ENCOUNTER — Telehealth: Payer: Self-pay | Admitting: Radiation Oncology

## 2015-09-11 NOTE — Telephone Encounter (Signed)
Attempted twice (3/30, 4/5) to call patient to set up appointment but phone just keeps ringing no voice mail or anything, will let Melissa know and go from there

## 2015-09-19 ENCOUNTER — Encounter: Payer: Self-pay | Admitting: *Deleted

## 2015-09-20 ENCOUNTER — Encounter: Payer: Self-pay | Admitting: Radiation Oncology

## 2015-10-09 ENCOUNTER — Encounter: Payer: Self-pay | Admitting: Radiation Oncology

## 2015-10-09 ENCOUNTER — Ambulatory Visit
Admission: RE | Admit: 2015-10-09 | Discharge: 2015-10-09 | Disposition: A | Discharge status: Home / Self Care | Payer: Medicare Other | Source: Ambulatory Visit | Attending: Radiation Oncology | Admitting: Radiation Oncology

## 2015-10-09 VITALS — BP 142/58 | HR 87 | Temp 98.6°F | Resp 16 | Ht 61.0 in | Wt 131.5 lb

## 2015-10-09 DIAGNOSIS — Z510 Encounter for antineoplastic radiation therapy: Secondary | ICD-10-CM | POA: Diagnosis present

## 2015-10-09 DIAGNOSIS — C541 Malignant neoplasm of endometrium: Secondary | ICD-10-CM | POA: Diagnosis present

## 2015-10-09 HISTORY — DX: Malignant neoplasm of endometrium: C54.1

## 2015-10-09 NOTE — Progress Notes (Signed)
Radiation Oncology         (336) [phone] ________________________________  Initial Outpatient Consultation  Name: Cindy Anderson MRN: [id number]  Date: 10/09/2015  DOB: July 13, 1945  [facility][id number] [facility]  Dorothyann Gibbs, NP   REFERRING PHYSICIAN: Dorothyann Gibbs, NP  DIAGNOSIS: The encounter diagnosis was Endometrial cancer, FIGO stage IIIC2 ([place]). FIGO grade 3    HISTORY OF PRESENT ILLNESS::Cindy Anderson is a 70 y.o. female who presented to her PCP in mid-December 2016 for weakness, sob, dysphagia, weight loss, and a 3 month history of vaginal discharge and bleeding that became progressively worse. She was found to be anemic with HGB of 6.1. She was advised to go to the ED for a transfusion. CT of the chest/abd/pelvis showed an enlarged uterus with multiple fibroids.  The patient was referred to Dr. Roselie Awkward who biopsied a protruding cervical mass on 05/30/15, revealing coagulative necrotic tissue. The patient was then referred to Dr. Denman George on 06/06/15 who discussed the need of an exploratory laparotomy, TAH, BSO, radical tumor debulking.  The patient underwent a total abdominal hysterectomy bilateral salpingo-oophorectomy, posterior vaginectomy, pelvic and aortic lymphadenectomy on 06/11/15 by Dr Fermin Schwab in [place]. 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 She had deep myometrial invasion and involvement of pelvic (3/15) and para-aortic (9/14) lymph nodes as well as the posterior vagina involving the deep and peripheral tissue edges positive. Outside consultation with Belva Bertin  on the pathologic findings felt that the patient had a uterine carcinoma that has undergone de- differentiation and extension into the cervix. Originally this was felt to be a carcinosarcoma but was changed to undifferentiated carcinoma involving the cervical region on final pathologic report.  The patient completed 4 cycles of carboplatin and  Taxol chemotherapy (with the last cycle on 09/19/15) and is expected to have 6 cycles total at [facility], with the next cycle tomorrow. If her blood work allows, she will receive her 6th cycle 3 weeks after that injection.  Patient is referred to radiation oncology for consideration for extended field radiation given her high risk for recurrence.  PREVIOUS RADIATION THERAPY: No  PAST MEDICAL HISTORY:  has a past medical history of Endometrial cancer ([place]).    PAST SURGICAL HISTORY: Past Surgical History  Procedure Laterality Date  . Total abdominal hysterectomy w/ bilateral salpingoophorectomy  06/11/15    done at [place]: family history includes Breast cancer in her sister; CAD in her father; Dementia in her mother; Hypertension in her father, mother, and other.  SOCIAL HISTORY:  reports that she has never smoked. She does not have any smokeless tobacco history on file. She reports that she does not drink alcohol or use illicit drugs.  ALLERGIES: Review of patient's allergies indicates no known allergies.  MEDICATIONS:  Current Outpatient Prescriptions  Medication Sig Dispense Refill  . acetaminophen (TYLENOL) 325 MG tablet Take 650 mg by mouth.    . docusate sodium (COLACE) 100 MG capsule Take 100 mg by mouth.    . famotidine (PEPCID) 20 MG tablet Take 20 mg by mouth.    . ferrous sulfate 325 (65 FE) MG tablet Take 325 mg by mouth.    Marland Kitchen ibuprofen (ADVIL,MOTRIN) 600 MG tablet Take 600 mg by mouth. Reported on 10/09/2015    . Multiple Vitamins-Minerals (CENTRUM SILVER) tablet Take by mouth.    . vitamin C (ASCORBIC ACID) 500 MG tablet Take 500 mg by mouth daily.    Marland Kitchen nystatin (MYCOSTATIN)  100000 UNIT/ML suspension Take by mouth. Reported on 10/09/2015    . triamcinolone ointment (KENALOG) 0.1 % Apply topically. Reported on 10/09/2015     No current facility-administered medications for this encounter.    REVIEW OF SYSTEMS:  A 15 point review of systems is documented in the  electronic medical record. This was obtained by the nursing staff. However, I reviewed this with the patient to discuss relevant findings and make appropriate changes.  She denies vaginal bleeding. The patient denies headaches or dizziness.  PHYSICAL EXAM:  height is 5\' 1"  (1.549 m) and weight is 131 lb 8 oz (59.648 kg). Her oral temperature is 98.6 F (37 C). Her blood pressure is 142/58 and her pulse is 87. Her respiration is 16 and oxygen saturation is 98%.   General: Alert and oriented, in no acute distress Accompanied by husband on evaluation today HEENT: Head is normocephalic. Extraocular movements are intact. Oropharynx is clear. Neck: Neck is supple, no palpable cervical or supraclavicular lymphadenopathy. Heart: Regular in rate and rhythm with no murmurs, rubs, or gallops. Chest: Clear to auscultation bilaterally, with no rhonchi, wheezes, or rales. Abdomen: Soft, nontender, nondistended, with no rigidity or guarding. Long, vertical, laparotomy scar that is well healed with no sign of recurrence. Extremities: No cyanosis or edema. Lymphatics: see Neck Exam Skin: No concerning lesions. Musculoskeletal: symmetric strength and muscle tone throughout. Neurologic: Cranial nerves II through XII are grossly intact. No obvious focalities. Speech is fluent. Coordination is intact. Psychiatric: Judgment and insight are intact. Affect is appropriate. Pelvic exam deferred until simulation and planning day  ECOG = 1.5  LABORATORY DATA:  Lab Results  Component Value Date   WBC 9.7 06/21/2015   HGB 9.0* 06/21/2015   HCT 28.6* 06/21/2015   MCV 78.8* 06/21/2015   PLT 898* 06/21/2015   NEUTROABS 7.4* 06/21/2015   Lab Results  Component Value Date   NA 140 05/29/2015   K 3.2* 05/29/2015   CL 104 05/29/2015   CO2 27 05/29/2015   GLUCOSE 139* 05/29/2015   CREATININE 0.51 05/29/2015   CALCIUM 8.9 05/29/2015    RADIOGRAPHY: No results found.    IMPRESSION:  Stage IIIC-2 Grade 3  endometrial carcinoma With extension of tumor into the cervical area felt to be dedifferentiation of the original tumor  with positive pelvic and aortic nodes and vaginal involvement with positive surgical margins.  The patient is high risk for locoregional recurrence and would likely benefit with post-operative radiation treatment. Treatments will be directed to the pelvis and para-aortic region. I explained the process of treatment, side effects, and potential toxicities. The patient and her husband understand and in agreement with the treatment option. Would also consider a brachytherapy boost to the proximal vagina given pathologic findings and positive surgical margins.  PLAN: The patient will return in approximately 5 weeks for CT simulation.  her chemotherapy should be completed by that time. We would also do a pelvic exam prior to the CT simulation to rule out recurrence     ------------------------------------------------  Blair Promise, PhD, MD  This document serves as a record of services personally performed by Gery Pray, MD. It was created on his behalf by Darcus Austin, a trained medical scribe. The creation of this record is based on the scribe's personal observations and the provider's statements to them. This document has been checked and approved by the attending provider.

## 2015-10-09 NOTE — Progress Notes (Signed)
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:   Cindy Anderson presented with symptoms of: weakness with hgb of 6.1, vaginal discharge and bleeding in November 2016  Biopsies revealed:   06/11/15    Past/Anticipated interventions by Gyn/Onc surgery, if any: 06/11/15 - total abdominal hysterectomy bilateral salpingo-oophorectomy, posterior vaginectomy, pelvic and aortic lymphadenectomy   Past/Anticipated interventions by medical oncology, if any: had cycle 4 of carboplatin and Taxol chemotherapy on 09/19/15 - will have 6 cycles at [facility] - next cycle tomorrow    Weight changes, if any: no  Bowel/Bladder complaints, if any: No.,   Nausea/Vomiting, if any: no  Pain issues, if any:  no  SAFETY ISSUES:  Prior radiation? no  Pacemaker/ICD? no  Possible current pregnancy? no  Is the patient on methotrexate? no  Current Complaints / other details:  Patient is here with her husband.  She denies having any vaginal bleeding.  BP 142/58 mmHg  Pulse 87  Temp(Src) 98.6 F (37 C) (Oral)  Resp 16  Ht 5\' 1"  (1.549 m)  Wt 131 lb 8 oz (59.648 kg)  BMI 24.86 kg/m2  SpO2 98%

## 2015-10-11 NOTE — Addendum Note (Signed)
Encounter addended by: Jacqulyn Liner, RN on: 10/11/2015 10:28 AM<BR>     Documentation filed: Charges VN

## 2015-11-08 NOTE — Progress Notes (Signed)
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:   Cindy Anderson presented with symptoms of: weakness with hgb of 6.1, vaginal discharge and bleeding in November 2016  Biopsies revealed:   06/11/15    Past/Anticipated interventions by Gyn/Onc surgery, if any: 06/11/15 - total abdominal hysterectomy bilateral salpingo-oophorectomy, posterior vaginectomy, pelvic and aortic lymphadenectomy   Past/Anticipated interventions by medical oncology, if any: had cycle 4 of carboplatin and Taxol chemotherapy on 09/19/15 - will have 6 cycles at [facility]  Weight changes, if any: None  Bowel/Bladder complaints, if any:  None and states she empties completely without any dysuria  Questionable blood in her stool, but is eating a lot of beets.  Nausea/Vomiting, if any: No  Pain issues, if any:No  SAFETY ISSUES:  Prior radiation? No   Pacemaker/ICD? No  Possible current pregnancy? No  Is the patient on methotrexate? No  Current Complaints / other details:   Clot in right leg in January 2017 Colonoscopy - Took our small polyp and reports a larger polyp was not removed due to the possibility of bleeding.  No signs of bleeding per Gastroenterologist.Questionable blood in her stool, but is eating a lot of beets.    Sister Breast cancer.

## 2015-11-11 ENCOUNTER — Telehealth: Payer: Self-pay | Admitting: Radiation Oncology

## 2015-11-11 NOTE — Telephone Encounter (Signed)
Called patient to advise to keep current appointment

## 2015-11-13 ENCOUNTER — Ambulatory Visit
Admission: RE | Admit: 2015-11-13 | Discharge: 2015-11-13 | Disposition: A | Discharge status: Home / Self Care | Payer: Medicare Other | Source: Ambulatory Visit | Attending: Radiation Oncology | Admitting: Radiation Oncology

## 2015-11-13 ENCOUNTER — Encounter: Payer: Self-pay | Admitting: Radiation Oncology

## 2015-11-13 VITALS — BP 144/48 | HR 78 | Temp 98.0°F | Ht 61.0 in | Wt 134.8 lb

## 2015-11-13 DIAGNOSIS — C541 Malignant neoplasm of endometrium: Secondary | ICD-10-CM

## 2015-11-13 DIAGNOSIS — Z510 Encounter for antineoplastic radiation therapy: Secondary | ICD-10-CM | POA: Diagnosis not present

## 2015-11-13 NOTE — Progress Notes (Signed)
Radiation Oncology         [phone] ________________________________  Name: Cindy Anderson MRN: [id number]  Date: 11/13/2015  DOB: [birth year]  Follow-Up Visit Note  CC: [facility]  Dorothyann Gibbs, NP    ICD-9-CM ICD-10-CM   1. Endometrial cancer, FIGO stage IIIC2 ([place]) 182.0 C54.1     Diagnosis: Endometrial cancer, FIGO Stage IIIC2 . FIGO grade 3.  Narrative:  The patient returns today for routine follow-up. I met with her for initial consultation 10/09/2015. She denies any bowel or bladder complaints and mentions she empties completely without any dysuria. She denies nausea or vomiting. She denies any pain. She mentions that she feels good. She denies vaginal bleeding since I last saw her. Dr. Fermin Schwab performed a vaginal exam 10/31/2015 and mentioned to the patient that everything looked good. She finished chemotherapy 10/31/2015 and is ready to start radiation treatment.  ALLERGIES:  has No Known Allergies.  Meds: Current Outpatient Prescriptions  Medication Sig Dispense Refill  . acetaminophen (TYLENOL) 325 MG tablet Take 650 mg by mouth.    . docusate sodium (COLACE) 100 MG capsule Take 100 mg by mouth.    . famotidine (PEPCID) 20 MG tablet Take 20 mg by mouth.    . ferrous sulfate 325 (65 FE) MG tablet Take 325 mg by mouth.    Marland Kitchen ibuprofen (ADVIL,MOTRIN) 600 MG tablet Take 600 mg by mouth. Reported on 10/09/2015    . MAGNESIUM CHLORIDE ER PO Take 1 tablet by mouth 2 (two) times daily.    . Multiple Vitamins-Minerals (CENTRUM SILVER) tablet Take by mouth.    . vitamin C (ASCORBIC ACID) 500 MG tablet Take 500 mg by mouth daily.    Marland Kitchen nystatin (MYCOSTATIN) 100000 UNIT/ML suspension Take by mouth. Reported on 11/13/2015    . triamcinolone ointment (KENALOG) 0.1 % Apply topically. Reported on 11/13/2015     No current facility-administered medications for this encounter.    Physical Findings: The patient is in no acute distress. Patient is alert and oriented.  height is '5\' 1"'  (1.549 m) and weight is 134 lb 12.8 oz (61.145 kg). Her temperature is 98 F (36.7 C). Her blood pressure is 144/48 and her pulse is 78. .  No significant changes.  The lungs are clear to auscultation. The heart has a regular rhythm and rate. The abdomen is soft and nontender with normal bowel sounds. No palpable cervical, supraclavicular, or axillary adenopathy. No inguinal adenopathy. She has a long abdominal scar that is well healed.  On pelvic examination the external genitalia were unremarkable. A speculum exam was performed. There are no mucosal lesions noted in the vaginal vault. . On bimanual and rectovaginal examination there were no pelvic masses appreciated. The vaginal cuff is intact.  Lab Findings: Lab Results  Component Value Date   WBC 9.7 06/21/2015   HGB 9.0* 06/21/2015   HCT 28.6* 06/21/2015   MCV 78.8* 06/21/2015   PLT 898* 06/21/2015   BUN 15, creatinine 0.56 from May 22 at [facility]    Radiographic Findings: No results found.  Impression: Stage IIIC-2 Grade 3 endometrial carcinoma with extension of tumor into the cervical area felt to be dedifferentiation of the original tumor with positive pelvic and aortic nodes and vaginal involvement with positive surgical margins. The patient is high risk for locoregional recurrence and would likely benefit with post-operative radiation treatment. Would also benefit from a brachytherapy boost to the proximal vagina given pathologic findings and positive surgical margins.   Plan:  We discussed the role of IMRT and its side effects. We discussed that Imodium would be helpful to keep at home if she does have diarrhea from the radiation. We discussed the vaginal dilator and when she would be using it. We discussed that she would need 25 treatments with brachytherapy boost. She will undergo CT scans June 20 at [facility]. Assuming no distant metastasis the patient will then return to radiation oncology for  planning and treatment.  CT simulation will be scheduled for June 22 with treatments directed at the pelvis and periaortic area. ____________________________________  Blair Promise, PhD, MD    This document serves as a record of services personally performed by Gery Pray, MD. It was created on his behalf by Lendon Collar, a trained medical scribe. The creation of this record is based on the scribe's personal observations and the provider's statements to them. This document has been checked and approved by the attending provider.

## 2015-11-20 ENCOUNTER — Telehealth: Payer: Self-pay | Admitting: Oncology

## 2015-11-20 NOTE — Telephone Encounter (Signed)
Cindy Anderson called with questions regarding her CT simulation on 11/28/15, external beam radiation side effects and brachytherapy.  Answered her questions and also will email her the vaginal cuff therapy handout.

## 2015-11-27 ENCOUNTER — Telehealth: Payer: Self-pay | Admitting: Oncology

## 2015-11-27 NOTE — Telephone Encounter (Signed)
Cindy Anderson, [name]'s husband, called and asked to reschedule her CT [facility] appointment tomorrow.  He said something has come up with [name].  Transferred him to Abigail Butts in [place] [facility] to reschedule.

## 2015-11-28 ENCOUNTER — Ambulatory Visit: Payer: Medicare Other

## 2015-11-28 ENCOUNTER — Ambulatory Visit: Payer: Medicare Other | Admitting: Radiation Oncology

## 2015-12-05 ENCOUNTER — Ambulatory Visit: Payer: Medicare Other

## 2015-12-05 ENCOUNTER — Ambulatory Visit: Payer: Medicare Other | Admitting: Radiation Oncology

## 2015-12-16 NOTE — Progress Notes (Signed)
Does patient have an allergy to IV contrast dye?: No.   Has patient ever received premedication for IV contrast dye?: No.   Does patient take metformin?: No.  If patient does take metformin when was the last dose: n/a  Date of lab work: November 26, 2015 from [facility] BUN: 14  CR: 0.57  IV site: right chest port a cath   BP 161/66 mmHg  Pulse 85  Temp(Src) 98.6 F (37 C) (Oral)  Ht 5\' 1"  (1.549 m)  Wt 137 lb 8 oz (62.37 kg)  BMI 25.99 kg/m2  SpO2 99%

## 2015-12-17 ENCOUNTER — Ambulatory Visit
Admission: RE | Admit: 2015-12-17 | Discharge: 2015-12-17 | Disposition: A | Discharge status: Home / Self Care | Payer: Medicare Other | Source: Ambulatory Visit | Attending: Radiation Oncology | Admitting: Radiation Oncology

## 2015-12-17 VITALS — BP 161/66 | HR 85 | Temp 98.6°F | Ht 61.0 in | Wt 137.5 lb

## 2015-12-17 DIAGNOSIS — C541 Malignant neoplasm of endometrium: Secondary | ICD-10-CM

## 2015-12-17 DIAGNOSIS — Z510 Encounter for antineoplastic radiation therapy: Secondary | ICD-10-CM | POA: Diagnosis not present

## 2015-12-17 MED ORDER — HEPARIN SOD (PORK) LOCK FLUSH 100 UNIT/ML IV SOLN
500.0000 [IU] | Freq: Once | INTRAVENOUS | Status: AC
  Administered 2015-12-17: 500 [IU] via INTRAVENOUS

## 2015-12-17 MED ORDER — SODIUM CHLORIDE 0.9% FLUSH
10.0000 mL | Freq: Once | INTRAVENOUS | Status: AC
  Administered 2015-12-17: 10 mL via INTRAVENOUS

## 2015-12-17 NOTE — Progress Notes (Signed)
  Radiation Oncology         (336) [phone] ________________________________  Name: Cindy Anderson MRN: [id number]  Date: 12/17/2015  DOB: April 20, 1946  SIMULATION AND TREATMENT PLANNING NOTE    ICD-9-CM ICD-10-CM   1. Endometrial cancer, FIGO stage IIIC2 ([facility]) 182.0 C54.1 sodium chloride flush (NS) 0.9 % injection 10 mL    DIAGNOSIS:  Endometrial cancer, FIGO Stage IIIC2 . FIGO grade 3.  NARRATIVE:  The patient was brought to the [place].  Identity was confirmed.  All relevant records and images related to the planned course of therapy were reviewed.  The patient freely provided informed written consent to proceed with treatment after reviewing the details related to the planned course of therapy. The consent form was witnessed and verified by the simulation staff.  Then, the patient was set-up in a stable reproducible  supine position for radiation therapy.  CT images were obtained.  Surface markings were placed.  The CT images were loaded into the planning software.  Then the target and avoidance structures were contoured.  Treatment planning then occurred.  The radiation prescription was entered and confirmed.  Then, I designed and supervised the construction of a total of 1 medically necessary complex treatment devices.  I have requested : Intensity Modulated Radiotherapy (IMRT) is medically necessary for this case for the following reason:  Small bowel, kidney, and bone marrow sparing..  I have ordered: Dose calc.  PLAN:  The patient will receive 45 Gy in 25 fractions to the pelvic / para-aortic and 18-24 Gy in 3-4 fractions using HDR boost to the vaginal cuff with iridium 192 as the high dose rate source given the positive surgical margins. -----------------------------------  Blair Promise, PhD, MD  This document serves as a record of services personally performed by Gery Pray, MD. It was created on his behalf by Arlyce Harman, a trained medical scribe. The creation  of this record is based on the scribe's personal observations and the provider's statements to them. This document has been checked and approved by the attending provider.

## 2015-12-24 DIAGNOSIS — Z510 Encounter for antineoplastic radiation therapy: Secondary | ICD-10-CM | POA: Diagnosis not present

## 2015-12-26 ENCOUNTER — Ambulatory Visit
Admission: RE | Admit: 2015-12-26 | Discharge: 2015-12-26 | Disposition: A | Discharge status: Home / Self Care | Payer: Medicare Other | Source: Ambulatory Visit | Attending: Radiation Oncology | Admitting: Radiation Oncology

## 2015-12-26 DIAGNOSIS — C541 Malignant neoplasm of endometrium: Secondary | ICD-10-CM

## 2015-12-26 DIAGNOSIS — Z510 Encounter for antineoplastic radiation therapy: Secondary | ICD-10-CM | POA: Diagnosis not present

## 2015-12-26 NOTE — Progress Notes (Signed)
  Radiation Oncology         [phone] ________________________________  Name: Cindy Anderson MRN: [id number]  Date: 12/26/2015  DOB: 08/15/1945  Simulation Verification Note    ICD-9-CM ICD-10-CM   1. Endometrial cancer, FIGO stage IIIC2 ([place]) 182.0 C54.1     Status: outpatient  NARRATIVE: The patient was brought to the treatment unit and placed in the planned treatment position. The clinical setup was verified. Then port films were obtained and uploaded to the radiation oncology medical record software.  The treatment beams were carefully compared against the planned radiation fields. The position location and shape of the radiation fields was reviewed. They targeted volume of tissue appears to be appropriately covered by the radiation beams. Organs at risk appear to be excluded as planned.  Based on my personal review, I approved the simulation verification. The patient's treatment will proceed as planned.  -----------------------------------  Blair Promise, PhD, MD

## 2015-12-27 ENCOUNTER — Ambulatory Visit
Admission: RE | Admit: 2015-12-27 | Discharge: 2015-12-27 | Disposition: A | Discharge status: Home / Self Care | Payer: Medicare Other | Source: Ambulatory Visit | Attending: Radiation Oncology | Admitting: Radiation Oncology

## 2015-12-27 DIAGNOSIS — Z510 Encounter for antineoplastic radiation therapy: Secondary | ICD-10-CM | POA: Diagnosis not present

## 2015-12-30 ENCOUNTER — Ambulatory Visit
Admission: RE | Admit: 2015-12-30 | Discharge: 2015-12-30 | Disposition: A | Discharge status: Home / Self Care | Payer: Federal, State, Local not specified - PPO | Source: Ambulatory Visit | Attending: Radiation Oncology | Admitting: Radiation Oncology

## 2015-12-30 ENCOUNTER — Ambulatory Visit
Admission: RE | Admit: 2015-12-30 | Discharge: 2015-12-30 | Disposition: A | Discharge status: Home / Self Care | Payer: Medicare Other | Source: Ambulatory Visit | Attending: Radiation Oncology | Admitting: Radiation Oncology

## 2015-12-30 DIAGNOSIS — C541 Malignant neoplasm of endometrium: Secondary | ICD-10-CM

## 2015-12-30 DIAGNOSIS — Z510 Encounter for antineoplastic radiation therapy: Secondary | ICD-10-CM | POA: Diagnosis not present

## 2015-12-30 NOTE — Progress Notes (Signed)
Pt here for patient teaching.  Pt given Radiation and You booklet. Reviewed areas of pertinence such as diarrhea, fatigue, nausea and vomiting, skin changes and urinary and bladder changes . Pt able to give teach back of to pat skin, use unscented/gentle soap, have Imodium on hand and drink plenty of water,avoid applying anything to skin within 4 hours of treatment. Pt demonstrated understanding and verbalizes understanding of information given and will contact nursing with any questions or concerns.          

## 2015-12-31 ENCOUNTER — Ambulatory Visit
Admission: RE | Admit: 2015-12-31 | Discharge: 2015-12-31 | Disposition: A | Discharge status: Home / Self Care | Payer: Federal, State, Local not specified - PPO | Source: Ambulatory Visit | Attending: Radiation Oncology | Admitting: Radiation Oncology

## 2015-12-31 ENCOUNTER — Encounter: Payer: Self-pay | Admitting: Radiation Oncology

## 2015-12-31 ENCOUNTER — Ambulatory Visit
Admission: RE | Admit: 2015-12-31 | Discharge: 2015-12-31 | Disposition: A | Discharge status: Home / Self Care | Payer: Medicare Other | Source: Ambulatory Visit | Attending: Radiation Oncology | Admitting: Radiation Oncology

## 2015-12-31 VITALS — BP 118/78 | HR 69 | Temp 97.8°F | Ht 61.0 in | Wt 138.6 lb

## 2015-12-31 DIAGNOSIS — C541 Malignant neoplasm of endometrium: Secondary | ICD-10-CM

## 2015-12-31 DIAGNOSIS — Z510 Encounter for antineoplastic radiation therapy: Secondary | ICD-10-CM | POA: Diagnosis not present

## 2015-12-31 NOTE — Progress Notes (Signed)
Cindy Anderson has completed 4 fractions to her pelvis/para-aortic area.  She reports having sharp pain in her left side and lower back that started on Thursday.  She said she had some of these pains after surgery but says this pain is different.  She reports having nausea and vomiting Thursday last night.  She is taking zofran 2 hours before her treatment and has not had nausea since.  She denies having any bladder issues.  She reports having a small amount of incontinence of bowel this morning.  She said this has not happened before.  She denies having any vaginal/rectal bleeding this week.  She had a rash on her left neck that started on Friday.  She has been applying benadryl cream to the area and it is now peeling.    BP 118/78 (BP Location: Right Arm, Patient Position: Sitting)   Pulse 69   Temp 97.8 F (36.6 C) (Oral)   Ht 5\' 1"  (1.549 m)   Wt 138 lb 9.6 oz (62.9 kg)   SpO2 100%   BMI 26.19 kg/m    Wt Readings from Last 3 Encounters:  12/31/15 138 lb 9.6 oz (62.9 kg)  12/17/15 137 lb 8 oz (62.4 kg)  11/13/15 134 lb 12.8 oz (61.1 kg)

## 2015-12-31 NOTE — Progress Notes (Signed)
  Radiation Oncology         [phone] ________________________________  Name: Cindy Anderson MRN: [id number]  Date: 12/31/2015  DOB: [birth year]  Weekly Radiation Therapy Management    ICD-9-CM ICD-10-CM   1. Endometrial cancer, FIGO stage IIIC2 ([place]) 182.0 C54.1     Current Dose: 7.2 Gy     Planned Dose:  45 + Gy  Narrative . . . . . . . . The patient presents for routine under treatment assessment.                            Cindy Anderson has completed 4 fractions to her pelvis/para-aortic area. She reports having sharp pain in her left side and lower back that started on Thursday. She said she had some of these pains after surgery, but states this pain is different.  She reports having nausea and vomiting Thursday and last night. She is taking zofran 2 hours before her treatment and has not had nausea since.  She denies having any bladder issues.  She reports having a small amount of incontinence of bowel this morning.  She said this has not happened before.  She denies having any vaginal/rectal bleeding this week.  She had a rash on her left neck that started on Friday.  She has been applying benadryl cream to the area and it is now peeling. She also mentions her left breast aches. She would also like to know if she could go see the dentist.                                 Set-up films were reviewed.                                 The chart was checked. Physical Findings. . .  height is 5\' 1"  (1.549 m) and weight is 138 lb 9.6 oz (62.9 kg). Her oral temperature is 97.8 F (36.6 C). Her blood pressure is 118/78 and her pulse is 69. Her oxygen saturation is 100%. . Weight essentially stable. She is ambulatory with a cane. Lungs are clear to auscultation bilaterally. Heart has regular rate and rhythm. Erythematous skin on her left neck with no skin breakdown. Impression . . . . . . . The patient is tolerating radiation. Plan . . . . . . . . . . . . Continue treatment as planned. I advised the  patient that she could see the dentist since she is no longer on chemotherapy. She will take her Zofran prior to radiation treatment since she has nausea.  ________________________________   Blair Promise, PhD, MD  This document serves as a record of services personally performed by Gery Pray, MD. It was created on his behalf by Darcus Austin, a trained medical scribe. The creation of this record is based on the scribe's personal observations and the provider's statements to them. This document has been checked and approved by the attending provider.

## 2016-01-01 ENCOUNTER — Ambulatory Visit
Admission: RE | Admit: 2016-01-01 | Discharge: 2016-01-01 | Disposition: A | Discharge status: Home / Self Care | Payer: Medicare Other | Source: Ambulatory Visit | Attending: Radiation Oncology | Admitting: Radiation Oncology

## 2016-01-01 DIAGNOSIS — Z510 Encounter for antineoplastic radiation therapy: Secondary | ICD-10-CM | POA: Diagnosis not present

## 2016-01-02 ENCOUNTER — Ambulatory Visit
Admission: RE | Admit: 2016-01-02 | Discharge: 2016-01-02 | Disposition: A | Discharge status: Home / Self Care | Payer: Medicare Other | Source: Ambulatory Visit | Attending: Radiation Oncology | Admitting: Radiation Oncology

## 2016-01-02 DIAGNOSIS — Z510 Encounter for antineoplastic radiation therapy: Secondary | ICD-10-CM | POA: Diagnosis not present

## 2016-01-03 ENCOUNTER — Ambulatory Visit
Admission: RE | Admit: 2016-01-03 | Discharge: 2016-01-03 | Disposition: A | Discharge status: Home / Self Care | Payer: Medicare Other | Source: Ambulatory Visit | Attending: Radiation Oncology | Admitting: Radiation Oncology

## 2016-01-03 DIAGNOSIS — Z510 Encounter for antineoplastic radiation therapy: Secondary | ICD-10-CM | POA: Diagnosis not present

## 2016-01-06 ENCOUNTER — Ambulatory Visit
Admission: RE | Admit: 2016-01-06 | Discharge: 2016-01-06 | Disposition: A | Discharge status: Home / Self Care | Payer: Medicare Other | Source: Ambulatory Visit | Attending: Radiation Oncology | Admitting: Radiation Oncology

## 2016-01-06 DIAGNOSIS — Z510 Encounter for antineoplastic radiation therapy: Secondary | ICD-10-CM | POA: Diagnosis not present

## 2016-01-07 ENCOUNTER — Encounter: Payer: Self-pay | Admitting: Radiation Oncology

## 2016-01-07 ENCOUNTER — Ambulatory Visit
Admission: RE | Admit: 2016-01-07 | Discharge: 2016-01-07 | Disposition: A | Discharge status: Home / Self Care | Payer: Federal, State, Local not specified - PPO | Source: Ambulatory Visit | Attending: Radiation Oncology | Admitting: Radiation Oncology

## 2016-01-07 ENCOUNTER — Ambulatory Visit
Admission: RE | Admit: 2016-01-07 | Discharge: 2016-01-07 | Disposition: A | Discharge status: Home / Self Care | Payer: Medicare Other | Source: Ambulatory Visit | Attending: Radiation Oncology | Admitting: Radiation Oncology

## 2016-01-07 VITALS — BP 142/58 | HR 76 | Temp 97.9°F | Ht 61.0 in | Wt 137.1 lb

## 2016-01-07 DIAGNOSIS — Z510 Encounter for antineoplastic radiation therapy: Secondary | ICD-10-CM | POA: Diagnosis present

## 2016-01-07 DIAGNOSIS — C541 Malignant neoplasm of endometrium: Secondary | ICD-10-CM | POA: Insufficient documentation

## 2016-01-07 NOTE — Progress Notes (Signed)
  Radiation Oncology         [phone] ________________________________  Name: Cindy Anderson MRN: [id number]  Date: 01/07/2016  DOB: 1946/05/25  Weekly Radiation Therapy Management    ICD-9-CM ICD-10-CM   1. Endometrial cancer, FIGO stage IIIC2 ([place]) 182.0 C54.1     Current Dose: 16.2 Gy     Planned Dose:  45 + Gy  Narrative . . . . . . . . The patient presents for routine under treatment assessment.  Cindy Anderson has completed 9 fractions to her pelvis. She denies pain other than occasional sharp pains in her lower abdomen and left breast.  She reports having occasional nausea and takes Zofran as needed.  She reports dysuria at the end of last week  She said it is gone now.  She denies having hematuria or vaginal bleeding.  She continues to report seeing a little bit of blood when wiping after a bowel movement.  She continues to report having urgency with bowel movements.  She denies diarrhea or having an increase in fatigue.                                 Set-up films were reviewed.                                 The chart was checked. Physical Findings. . .  height is 5\' 1"  (1.549 m) and weight is 137 lb 1.6 oz (62.2 kg). Her oral temperature is 97.9 F (36.6 C). Her blood pressure is 142/58 (abnormal) and her pulse is 76. Her oxygen saturation is 99%. . Weight essentially stable. She is ambulatory with a cane. Lungs are clear to auscultation bilaterally. Heart has regular rate and rhythm.  Abdomen soft and nontender with normal bowel sounds Impression . . . . . . . The patient is tolerating radiation. Plan . . . . . . . . . . . . Continue treatment as planned.  ________________________________   Blair Promise, PhD, MD  This document serves as a record of services personally performed by Gery Pray, MD. It was created on his behalf by Darcus Austin, a trained medical scribe. The creation of this record is based on the scribe's personal observations and the provider's statements to  them. This document has been checked and approved by the attending provider.

## 2016-01-07 NOTE — Progress Notes (Signed)
Cindy Anderson has completed 9 fractions to her pelvis.  She denies pain other that occasional sharp pains in her lower abdomen and left breast.  She reports having occasional nausea and take Zofran as needed.  She reports having burning with urination between her vagina and rectum at the end of last week  She said it is gone now.  She denies having hematuria or vaginal bleeding.  She continues to report seeing a little bit of blood when wiping after a bowel movement.  She continues to report having urgency with bowel movements.  She denies having diarrhea. She denies having an increase in fatigue.  BP (!) 142/58 (BP Location: Right Arm, Patient Position: Sitting)   Pulse 76   Temp 97.9 F (36.6 C) (Oral)   Ht 5\' 1"  (1.549 m)   Wt 137 lb 1.6 oz (62.2 kg)   SpO2 99%   BMI 25.90 kg/m    Wt Readings from Last 3 Encounters:  01/07/16 137 lb 1.6 oz (62.2 kg)  12/31/15 138 lb 9.6 oz (62.9 kg)  12/17/15 137 lb 8 oz (62.4 kg)

## 2016-01-08 ENCOUNTER — Ambulatory Visit
Admission: RE | Admit: 2016-01-08 | Discharge: 2016-01-08 | Disposition: A | Discharge status: Home / Self Care | Payer: Medicare Other | Source: Ambulatory Visit | Attending: Radiation Oncology | Admitting: Radiation Oncology

## 2016-01-08 DIAGNOSIS — Z510 Encounter for antineoplastic radiation therapy: Secondary | ICD-10-CM | POA: Diagnosis not present

## 2016-01-09 ENCOUNTER — Ambulatory Visit
Admission: RE | Admit: 2016-01-09 | Discharge: 2016-01-09 | Disposition: A | Discharge status: Home / Self Care | Payer: Medicare Other | Source: Ambulatory Visit | Attending: Radiation Oncology | Admitting: Radiation Oncology

## 2016-01-09 DIAGNOSIS — Z510 Encounter for antineoplastic radiation therapy: Secondary | ICD-10-CM | POA: Diagnosis not present

## 2016-01-10 ENCOUNTER — Ambulatory Visit
Admission: RE | Admit: 2016-01-10 | Discharge: 2016-01-10 | Disposition: A | Discharge status: Home / Self Care | Payer: Medicare Other | Source: Ambulatory Visit | Attending: Radiation Oncology | Admitting: Radiation Oncology

## 2016-01-10 DIAGNOSIS — Z510 Encounter for antineoplastic radiation therapy: Secondary | ICD-10-CM | POA: Diagnosis not present

## 2016-01-13 ENCOUNTER — Ambulatory Visit
Admission: RE | Admit: 2016-01-13 | Discharge: 2016-01-13 | Disposition: A | Discharge status: Home / Self Care | Payer: Medicare Other | Source: Ambulatory Visit | Attending: Radiation Oncology | Admitting: Radiation Oncology

## 2016-01-13 DIAGNOSIS — Z510 Encounter for antineoplastic radiation therapy: Secondary | ICD-10-CM | POA: Diagnosis not present

## 2016-01-14 ENCOUNTER — Ambulatory Visit
Admission: RE | Admit: 2016-01-14 | Discharge: 2016-01-14 | Disposition: A | Discharge status: Home / Self Care | Payer: Medicare Other | Source: Ambulatory Visit | Attending: Radiation Oncology | Admitting: Radiation Oncology

## 2016-01-14 ENCOUNTER — Encounter: Payer: Self-pay | Admitting: Radiation Oncology

## 2016-01-14 ENCOUNTER — Telehealth: Payer: Self-pay | Admitting: Oncology

## 2016-01-14 VITALS — BP 160/64 | HR 80 | Temp 98.4°F | Resp 20 | Wt 139.0 lb

## 2016-01-14 DIAGNOSIS — C541 Malignant neoplasm of endometrium: Secondary | ICD-10-CM

## 2016-01-14 DIAGNOSIS — Z510 Encounter for antineoplastic radiation therapy: Secondary | ICD-10-CM | POA: Diagnosis not present

## 2016-01-14 NOTE — Progress Notes (Signed)
Weekly rad txs 14/25 to pelvis/paraaortic  Completed, abdominal gas, blood in stools, has had diarrhea over the weekend and once today,not taking anything, no pin, bladder normal no c/o, no discharge ot hmaturia; wearing a mask for self preservatin, appetie good, fatigue over the weekend, better since yesterday no pain 12:29 PM BP (!) 160/64 (BP Location: Right Arm, Patient Position: Sitting, Cuff Size: Normal)   Pulse 80   Temp 98.4 F (36.9 C) (Oral)   Resp 20   Wt 139 lb (63 kg)   BMI 26.26 kg/m   Wt Readings from Last 3 Encounters:  01/14/16 139 lb (63 kg)  01/07/16 137 lb 1.6 oz (62.2 kg)  12/31/15 138 lb 9.6 oz (62.9 kg)

## 2016-01-14 NOTE — Telephone Encounter (Signed)
Called in prescription for Anusol suppositories to [facility] per Dr. Sondra Come.

## 2016-01-14 NOTE — Progress Notes (Signed)
  Radiation Oncology         [phone] ________________________________  Name: Cindy Anderson MRN: [id number]  Date: 01/14/2016  DOB: January 09, 1946  Weekly Radiation Therapy Management    ICD-9-CM ICD-10-CM   1. Endometrial cancer, FIGO stage IIIC2 ([place]) 182.0 C54.1     Current Dose: 25.2 Gy     Planned Dose:  45 + Gy  Narrative . . . . . . . . The patient presents for routine under treatment assessment.           Weekly rad txs 14/25 to pelvis/paraaortic nodes. She reports abdominal gas, blood in stool, diarrhea over the weekend and once today. She is not taking anything for the diarrhea. She has no pain, a normal bladder, no discharge or hematuria. She has a good appetite, and fatigue that is better since yesterday. She is wearing a mask To limit exposure.                                 Set-up films were reviewed.                                 The chart was checked. Physical Findings. . .  weight is 139 lb (63 kg). Her oral temperature is 98.4 F (36.9 C). Her blood pressure is 160/64 (abnormal) and her pulse is 80. Her respiration is 20. . Weight essentially stable. She is ambulatory with a cane. Lungs are clear to auscultation bilaterally. Heart has regular rate and rhythm.  Abdomen soft and nontender with normal bowel sounds Impression . . . . . . . The patient is tolerating radiation. Plan . . . . . . . . . . . . Continue treatment as planned. I will give her steroid rectal suppositories for her rectal irritation and bleeding. ________________________________   Blair Promise, PhD, MD  This document serves as a record of services personally performed by Gery Pray, MD. It was created on his behalf by Darcus Austin, a trained medical scribe. The creation of this record is based on the scribe's personal observations and the provider's statements to them. This document has been checked and approved by the attending provider.

## 2016-01-15 ENCOUNTER — Ambulatory Visit
Admission: RE | Admit: 2016-01-15 | Discharge: 2016-01-15 | Disposition: A | Discharge status: Home / Self Care | Payer: Medicare Other | Source: Ambulatory Visit | Attending: Radiation Oncology | Admitting: Radiation Oncology

## 2016-01-15 DIAGNOSIS — Z510 Encounter for antineoplastic radiation therapy: Secondary | ICD-10-CM | POA: Diagnosis not present

## 2016-01-16 ENCOUNTER — Ambulatory Visit
Admission: RE | Admit: 2016-01-16 | Discharge: 2016-01-16 | Disposition: A | Discharge status: Home / Self Care | Payer: Medicare Other | Source: Ambulatory Visit | Attending: Radiation Oncology | Admitting: Radiation Oncology

## 2016-01-16 DIAGNOSIS — Z510 Encounter for antineoplastic radiation therapy: Secondary | ICD-10-CM | POA: Diagnosis not present

## 2016-01-17 ENCOUNTER — Ambulatory Visit
Admission: RE | Admit: 2016-01-17 | Discharge: 2016-01-17 | Disposition: A | Discharge status: Home / Self Care | Payer: Medicare Other | Source: Ambulatory Visit | Attending: Radiation Oncology | Admitting: Radiation Oncology

## 2016-01-17 DIAGNOSIS — Z510 Encounter for antineoplastic radiation therapy: Secondary | ICD-10-CM | POA: Diagnosis not present

## 2016-01-20 ENCOUNTER — Ambulatory Visit
Admission: RE | Admit: 2016-01-20 | Discharge: 2016-01-20 | Disposition: A | Discharge status: Home / Self Care | Payer: Medicare Other | Source: Ambulatory Visit | Attending: Radiation Oncology | Admitting: Radiation Oncology

## 2016-01-20 DIAGNOSIS — Z510 Encounter for antineoplastic radiation therapy: Secondary | ICD-10-CM | POA: Diagnosis not present

## 2016-01-21 ENCOUNTER — Encounter: Payer: Self-pay | Admitting: Radiation Oncology

## 2016-01-21 ENCOUNTER — Ambulatory Visit
Admission: RE | Admit: 2016-01-21 | Discharge: 2016-01-21 | Disposition: A | Discharge status: Home / Self Care | Payer: Medicare Other | Source: Ambulatory Visit | Attending: Radiation Oncology | Admitting: Radiation Oncology

## 2016-01-21 VITALS — BP 152/60 | HR 78 | Temp 98.0°F | Ht 61.0 in | Wt 138.1 lb

## 2016-01-21 DIAGNOSIS — Z510 Encounter for antineoplastic radiation therapy: Secondary | ICD-10-CM | POA: Diagnosis not present

## 2016-01-21 DIAGNOSIS — C541 Malignant neoplasm of endometrium: Secondary | ICD-10-CM

## 2016-01-21 NOTE — Progress Notes (Signed)
Patient was given a sitz bath and was instructed on how to use it.

## 2016-01-21 NOTE — Progress Notes (Signed)
Cindy Anderson has completed 19 fractions to her pelvis.  She denies having pain.  She reports having some irritation between her vaginal area and rectum that burns.  She denies having any bladder issues.  She reports having diarrhea after treatment yesterday.  She has not use the Anusol suppositories yet and does report having a small amount of rectal bleeding.  She denies having any vaginal bleeding.  She reports having occasional fatigue.  She denies having nausea.  BP (!) 152/60 (BP Location: Right Arm, Patient Position: Sitting)   Pulse 78   Temp 98 F (36.7 C) (Oral)   Ht 5\' 1"  (1.549 m)   Wt 138 lb 1.6 oz (62.6 kg)   SpO2 100%   BMI 26.09 kg/m    Wt Readings from Last 3 Encounters:  01/21/16 138 lb 1.6 oz (62.6 kg)  01/14/16 139 lb (63 kg)  01/07/16 137 lb 1.6 oz (62.2 kg)

## 2016-01-21 NOTE — Progress Notes (Signed)
  Radiation Oncology         [phone] ________________________________  Name: Cindy Anderson MRN: [id number]  Date: 01/21/2016  DOB: [birth year]  Weekly Radiation Therapy Management    ICD-9-CM ICD-10-CM   1. Endometrial cancer, FIGO stage IIIC2 ([place]) 182.0 C54.1     Current Dose: 34.2 Gy     Planned Dose:  45 + Gy  Narrative . . . . . . . . The patient presents for routine under treatment assessment.           Cindy Anderson has completed 19 fractions to her pelvis.  She denies having pain.  She reports having some irritation between her vaginal area and rectum that burns.  She denies having any bladder issues.  She reports having diarrhea after treatment yesterday.  She has not used the Anusol suppositories yet and does report having a small amount of rectal bleeding.  She denies having any vaginal bleeding.  She reports having occasional fatigue.  She denies nausea.                                 Set-up films were reviewed.                                 The chart was checked. Physical Findings. . .  height is 5\' 1"  (1.549 m) and weight is 138 lb 1.6 oz (62.6 kg). Her oral temperature is 98 F (36.7 C). Her blood pressure is 152/60 (abnormal) and her pulse is 78. Her oxygen saturation is 100%. . Weight essentially stable. She is ambulatory with a cane. Lungs are clear to auscultation bilaterally. Heart has regular rate and rhythm.  Abdomen soft and nontender with normal bowel sounds. Impression . . . . . . . The patient is tolerating radiation. Plan . . . . . . . . . . . . Continue treatment as planned. A sitz bath was provided. I explained the process of vaginal brachytherapy treatment. ________________________________   Blair Promise, PhD, MD  This document serves as a record of services personally performed by Gery Pray, MD. It was created on his behalf by Darcus Austin, a trained medical scribe. The creation of this record is based on the scribe's personal observations and the  provider's statements to them. This document has been checked and approved by the attending provider.

## 2016-01-22 ENCOUNTER — Ambulatory Visit
Admission: RE | Admit: 2016-01-22 | Discharge: 2016-01-22 | Disposition: A | Discharge status: Home / Self Care | Payer: Medicare Other | Source: Ambulatory Visit | Attending: Radiation Oncology | Admitting: Radiation Oncology

## 2016-01-22 DIAGNOSIS — Z510 Encounter for antineoplastic radiation therapy: Secondary | ICD-10-CM | POA: Diagnosis not present

## 2016-01-23 ENCOUNTER — Ambulatory Visit: Payer: Medicare Other

## 2016-01-23 DIAGNOSIS — Z510 Encounter for antineoplastic radiation therapy: Secondary | ICD-10-CM | POA: Diagnosis not present

## 2016-01-24 ENCOUNTER — Ambulatory Visit
Admission: RE | Admit: 2016-01-24 | Discharge: 2016-01-24 | Disposition: A | Discharge status: Home / Self Care | Payer: Medicare Other | Source: Ambulatory Visit | Attending: Radiation Oncology | Admitting: Radiation Oncology

## 2016-01-24 DIAGNOSIS — Z510 Encounter for antineoplastic radiation therapy: Secondary | ICD-10-CM | POA: Diagnosis not present

## 2016-01-27 ENCOUNTER — Ambulatory Visit
Admission: RE | Admit: 2016-01-27 | Discharge: 2016-01-27 | Disposition: A | Discharge status: Home / Self Care | Payer: Medicare Other | Source: Ambulatory Visit | Attending: Radiation Oncology | Admitting: Radiation Oncology

## 2016-01-27 DIAGNOSIS — Z510 Encounter for antineoplastic radiation therapy: Secondary | ICD-10-CM | POA: Diagnosis not present

## 2016-01-28 ENCOUNTER — Encounter: Payer: Self-pay | Admitting: Radiation Oncology

## 2016-01-28 ENCOUNTER — Ambulatory Visit
Admission: RE | Admit: 2016-01-28 | Discharge: 2016-01-28 | Disposition: A | Discharge status: Home / Self Care | Payer: Medicare Other | Source: Ambulatory Visit | Attending: Radiation Oncology | Admitting: Radiation Oncology

## 2016-01-28 VITALS — BP 149/67 | HR 77 | Temp 98.0°F | Resp 18 | Ht 61.0 in | Wt 135.8 lb

## 2016-01-28 DIAGNOSIS — Z510 Encounter for antineoplastic radiation therapy: Secondary | ICD-10-CM | POA: Diagnosis not present

## 2016-01-28 DIAGNOSIS — C541 Malignant neoplasm of endometrium: Secondary | ICD-10-CM

## 2016-01-28 NOTE — Progress Notes (Signed)
  Radiation Oncology         (336) [phone] ________________________________  Name: Cindy Anderson MRN: [id number]  Date: 01/28/2016  DOB: 06-22-1945  Weekly Radiation Therapy Management  Endometrial cancer, FIGO stage IIIC2     ICD-9-CM ICD-10-CM   1. Endometrial cancer, FIGO stage IIIC2 (Cindy Anderson) 182.0 C54.1     Current Dose: 43.2 Gy     Planned Dose:  45 + Gy  Narrative . . . . . . . . The patient presents for routine under treatment assessment.           Cindy Anderson has completed 24 fractions to her pelvis.  Having pain when she urinates 4/10. She reports having some irritation between her vaginal area and rectum that burns. She denies having any bladder issues. She reports having diarrhea once or twice the past week. She has not use the Anusol suppositories yet and does report having a small amount of rectal bleeding one or two days. She denies having any vaginal bleeding. Experienced some fatigue on Saturday.  Appetite is normal.                                   Set-up films were reviewed.                                 The chart was checked. Physical Findings. . .  height is 5\' 1"  (1.549 m) and weight is 135 lb 12.8 oz (61.6 kg). Her oral temperature is 98 F (36.7 C). Her blood pressure is 149/67 (abnormal) and her pulse is 77. Her respiration is 18 and oxygen saturation is 100%. . Weight essentially stable. She is ambulatory with a cane. Lungs are clear to auscultation bilaterally. Heart has regular rate and rhythm.  Abdomen soft and nontender with normal bowel sounds. Impression . . . . . . . The patient is tolerating radiation. Plan . . . . . . . . . . . . Continue treatment as planned. HDR treatments will start approximately 2 weeks out from her external beam. . ________________________________   Blair Promise, PhD, MD  This document serves as a record of services personally performed by Gery Pray, MD. It was created on his behalf by Truddie Hidden, a trained medical  scribe. The creation of this record is based on the scribe's personal observations and the provider's statements to them. This document has been checked and approved by the attending provider.

## 2016-01-28 NOTE — Progress Notes (Signed)
Cindy Anderson has completed 24 fractions to her pelvis.  Having pain when she urinates 4/10.  She reports having some irritation between her vaginal area and rectum that burns.  She denies having any bladder issues.  She reports having diarrhea once or twice the past week.  She has not use the Anusol suppositories yet and does report having a small amount of rectal bleeding one or two days.  She denies having any vaginal bleeding.  Experienced some fatigue on Saturday.  Appetite is normal.  Wt Readings from Last 3 Encounters:  01/28/16 135 lb 12.8 oz (61.6 kg)  01/21/16 138 lb 1.6 oz (62.6 kg)  01/14/16 139 lb (63 kg)  BP (!) 149/67 (BP Location: Right Arm, Patient Position: Sitting, Cuff Size: Normal)   Pulse 77   Temp 98 F (36.7 C) (Oral)   Resp 18   Ht 5\' 1"  (1.549 m)   Wt 135 lb 12.8 oz (61.6 kg)   SpO2 100%   BMI 25.66 kg/m

## 2016-01-29 ENCOUNTER — Ambulatory Visit
Admission: RE | Admit: 2016-01-29 | Discharge: 2016-01-29 | Disposition: A | Discharge status: Home / Self Care | Payer: Medicare Other | Source: Ambulatory Visit | Attending: Radiation Oncology | Admitting: Radiation Oncology

## 2016-01-29 DIAGNOSIS — Z510 Encounter for antineoplastic radiation therapy: Secondary | ICD-10-CM | POA: Diagnosis not present

## 2016-02-05 ENCOUNTER — Ambulatory Visit: Payer: Medicare Other | Admitting: Radiation Oncology

## 2016-02-05 ENCOUNTER — Ambulatory Visit: Payer: Self-pay | Admitting: Radiation Oncology

## 2016-02-12 ENCOUNTER — Ambulatory Visit: Payer: Medicare Other | Admitting: Radiation Oncology

## 2016-02-12 ENCOUNTER — Telehealth: Payer: Self-pay | Admitting: *Deleted

## 2016-02-12 NOTE — Telephone Encounter (Signed)
CALLED PATIENT TO REMIND OF APPTS. FOR 02-13-16, SPOKE WITH (SGO), ROBERT DUNN AND HE IS AWARE OF THESE APPTS.

## 2016-02-13 ENCOUNTER — Ambulatory Visit: Payer: Medicare Other

## 2016-02-13 ENCOUNTER — Ambulatory Visit
Admission: RE | Admit: 2016-02-13 | Discharge: 2016-02-13 | Disposition: A | Discharge status: Home / Self Care | Payer: Medicare Other | Source: Ambulatory Visit | Attending: Radiation Oncology | Admitting: Radiation Oncology

## 2016-02-13 DIAGNOSIS — C541 Malignant neoplasm of endometrium: Secondary | ICD-10-CM | POA: Insufficient documentation

## 2016-02-13 DIAGNOSIS — Z510 Encounter for antineoplastic radiation therapy: Secondary | ICD-10-CM | POA: Insufficient documentation

## 2016-02-13 NOTE — Progress Notes (Signed)
  Radiation Oncology         [phone] ________________________________  Name: Cindy Anderson MRN: [id number]  Date: 02/13/2016  DOB: 1945/07/23  SIMULATION AND TREATMENT PLANNING NOTE HDR BRACHYTHERAPY  DIAGNOSIS:  Stage IIIC-2 Grade 3 endometrial carcinoma  NARRATIVE:  The patient was brought to the [name].  Identity was confirmed.  All relevant records and images related to the planned course of therapy were reviewed.  The patient freely provided informed written consent to proceed with treatment after reviewing the details related to the planned course of therapy. The consent form was witnessed and verified by the simulation staff.  Then, the patient was set-up in a stable reproducible  supine position for radiation therapy.  CT images were obtained.  Surface markings were placed.  The CT images were loaded into the planning software.  Then the target and avoidance structures were contoured.  Treatment planning then occurred.  The radiation prescription was entered and confirmed.   I have requested : Brachytherapy Isodose Plan and Dosimetry Calculations to plan the radiation distribution.    PLAN:  The patient will receive 18 Gy in 3 fractions. ________________________________  Blair Promise, PhD, MD  This document serves as a record of services personally performed by Gery Pray, MD. It was created on his behalf by Darcus Austin, a trained medical scribe. The creation of this record is based on the scribe's personal observations and the provider's statements to them. This document has been checked and approved by the attending provider.

## 2016-02-13 NOTE — Progress Notes (Signed)
Radiation Oncology         [phone] ________________________________  Name: Cindy Anderson MRN: [id number]  Date: 02/13/2016  DOB: 1946/01/16  Vaginal Brachytherapy Procedure Note  CC: [facility] Dorothyann Gibbs, NP    ICD-9-CM ICD-10-CM   1. Endometrial cancer, FIGO stage IIIC2 ([place]) 182.0 C54.1     Diagnosis: Stage IIIC-2 Grade 3 endometrial carcinoma  Radiation Treatment Dates: 12/26/15 - 01/29/16. 45 Gy to the pelvis/para-aortic nodes  Narrative: She returns today for vaginal cylinder fitting. She denies having pain.  She reports having some burning with urination that started half way through external beam radiation.  She thinks this is due to skin irritation on her labia.  She reports having blood in her stools about once a day.  She said this started in mid-April.  She denies having diarrhea, but does have 3-4 bowel movements per day.  She reports having occasional nausea.  She denies having fatigue.  ALLERGIES: has No Known Allergies.  Meds: Current Outpatient Prescriptions  Medication Sig Dispense Refill  . famotidine (PEPCID) 20 MG tablet Take 20 mg by mouth daily as needed.     . ferrous sulfate 325 (65 FE) MG tablet Take 325 mg by mouth daily with breakfast.     . magnesium chloride (SLOW-MAG) 64 MG TBEC SR tablet Take 71.5 mg by mouth 2 (two) times daily. Reported on 12/17/2015    . Multiple Vitamins-Minerals (CENTRUM SILVER) tablet Take 1 tablet by mouth daily.     . ondansetron (ZOFRAN-ODT) 4 MG disintegrating tablet Take 4 mg by mouth every 8 (eight) hours as needed for nausea or vomiting. Reported on 12/17/2015    . vitamin C (ASCORBIC ACID) 500 MG tablet Take 500 mg by mouth daily.    Marland Kitchen acetaminophen (TYLENOL) 325 MG tablet Take 650 mg by mouth.    . docusate sodium (COLACE) 100 MG capsule Take 100 mg by mouth.    . hydrocortisone (ANUSOL-HC) 25 MG suppository Place 25 mg rectally 2 (two) times daily. Called in to [facility] per Dr. Sondra Come on 01/14/16.    .  ibuprofen (ADVIL,MOTRIN) 600 MG tablet Take 600 mg by mouth as needed. Reported on 10/09/2015    . prochlorperazine (COMPAZINE) 10 MG tablet Take 10 mg by mouth every 6 (six) hours as needed for nausea or vomiting. Reported on 12/17/2015    . triamcinolone ointment (KENALOG) 0.1 % Apply topically. Reported on 11/13/2015     No current facility-administered medications for this encounter.     Physical Findings: The patient is in no acute distress. Patient is alert and oriented.  height is 5\' 1"  (1.549 m) and weight is 137 lb 1.6 oz (62.2 kg). Her oral temperature is 98.1 F (36.7 C). Her blood pressure is 148/55 (abnormal) and her pulse is 75. Her oxygen saturation is 98%.   No palpable cervical, supraclavicular or axillary lymphoadenopathy. The heart has a regular rate and rhythm. The lungs are clear to auscultation. Abdomen soft and non-tender.  On pelvic examination the external genitalia were unremarkable. A speculum exam was performed, no mucosal lesions noted in the vaginal vault. On bimanual exam the vaginal cuff was noted to be intact.  Patient was fitted for a vaginal cylinder. The patient will be treated with a 3 cm diameter segmented cylinder. This distended the vaginal vault without undue discomfort. The patient tolerated the procedure well.  Lab Findings: Lab Results  Component Value Date   WBC 9.7 06/21/2015   HGB 9.0 (L) 06/21/2015  HCT 28.6 (L) 06/21/2015   MCV 78.8 (L) 06/21/2015   PLT 898 (H) 06/21/2015    Radiographic Findings: No results found.  Impression: Stage IIIC-2 Grade 3 endometrial carcinoma  The patient was successfully fitted for a vaginal brachytherapy cylinder. The patient is appropriate to begin vaginal brachytherapy.  Plan: The patient will proceed with CT simulation and vaginal brachytherapy later today.    _______________________________   Blair Promise, PhD, MD  This document serves as a record of services personally performed by Gery Pray, MD. It was created on his behalf by Darcus Austin, a trained medical scribe. The creation of this record is based on the scribe's personal observations and the provider's statements to them. This document has been checked and approved by the attending provider.

## 2016-02-13 NOTE — Progress Notes (Signed)
  Radiation Oncology         [phone] ________________________________  Name: Cindy Anderson MRN: [id number]  Date: 02/13/2016  DOB: 09/02/1945  CC: [facility]  Dorothyann Gibbs, NP  HDR BRACHYTHERAPY NOTE  DIAGNOSIS: Stage IIIC-2 Grade 3 endometrial carcinoma   Simple treatment device note: Patient had construction of her custom vaginal cylinder. She will be treated with a 3 cm diameter segmented cylinder. This conforms to her anatomy without undue discomfort.  Vaginal brachytherapy procedure node: The patient was brought to the [place] suite. Identity was confirmed. All relevant records and images related to the planned course of therapy were reviewed. The patient freely provided informed written consent to proceed with treatment after reviewing the details related to the planned course of therapy. The consent form was witnessed and verified by the simulation staff. Then, the patient was set-up in a stable reproducible supine position for radiation therapy. The patient's custom vaginal cylinder was placed in the proximal vagina. This was affixed to the CT/MR stabilization plate to prevent slippage. Patient tolerated the placement well.  Verification simulation note:  A fiducial marker was placed within the vaginal cylinder. An AP and lateral film was then obtained through the pelvis area. This documented accurate position of the vaginal cylinder for treatment.  HDR BRACHYTHERAPY TREATMENT  The remote afterloading device was affixed to the vaginal cylinder by catheter. Patient then proceeded to undergo her first high-dose-rate treatment directed at the proximal vagina. The patient was prescribed a dose of 6 gray to be delivered to the mucosal surface. Treatment length was 3 cm. Patient was treated with 1 channel using 7 dwell positions. Treatment time was 312.5 seconds. Iridium 192 was the high-dose-rate source for treatment. The patient tolerated the treatment well. After  completion of her therapy, a radiation survey was performed documenting return of the iridium source into the GammaMed safe.   PLAN: The patient will return for her second HDR treatment on 02/20/16. ________________________________  Blair Promise, PhD, MD   This document serves as a record of services personally performed by Gery Pray, MD. It was created on his behalf by Darcus Austin, a trained medical scribe. The creation of this record is based on the scribe's personal observations and the provider's statements to them. This document has been checked and approved by the attending provider.

## 2016-02-13 NOTE — Progress Notes (Signed)
Cindy Anderson here for follow up.  She denies having pain.  She reports having some burning with urination that started half way through external beam radiation.  She thinks this is due to skin irritation on her labia.  She reports having blood in her stools about once a day.  She said this started in mid-April.  She denies having diarrhea but does have 3-4 bowel movements per day.  She reports having occasional nausea.  She denies having fatigue.  BP (!) 148/55 (BP Location: Right Arm, Patient Position: Sitting)   Pulse 75   Temp 98.1 F (36.7 C) (Oral)   Ht 5\' 1"  (1.549 m)   Wt 137 lb 1.6 oz (62.2 kg)   SpO2 98%   BMI 25.90 kg/m    Wt Readings from Last 3 Encounters:  02/13/16 137 lb 1.6 oz (62.2 kg)  01/28/16 135 lb 12.8 oz (61.6 kg)  01/21/16 138 lb 1.6 oz (62.6 kg)

## 2016-02-19 ENCOUNTER — Ambulatory Visit: Payer: Medicare Other | Admitting: Radiation Oncology

## 2016-02-20 ENCOUNTER — Ambulatory Visit
Admission: RE | Admit: 2016-02-20 | Discharge: 2016-02-20 | Disposition: A | Discharge status: Home / Self Care | Payer: Medicare Other | Source: Ambulatory Visit | Attending: Radiation Oncology | Admitting: Radiation Oncology

## 2016-02-20 DIAGNOSIS — Z510 Encounter for antineoplastic radiation therapy: Secondary | ICD-10-CM | POA: Diagnosis not present

## 2016-02-20 DIAGNOSIS — C541 Malignant neoplasm of endometrium: Secondary | ICD-10-CM

## 2016-02-20 NOTE — Progress Notes (Signed)
  Radiation Oncology         [phone] ________________________________  Name: Cindy Anderson MRN: [id number]  Date: 02/20/2016  DOB: [birth year]  CC: [facility]  Dorothyann Gibbs, NP  HDR BRACHYTHERAPY NOTE  DIAGNOSIS: Stage IIIC-2 Grade 3 endometrial carcinoma   Simple treatment device note: Patient had construction of her custom vaginal cylinder. She will be treated with a 3 cm diameter segmented cylinder. This conforms to her anatomy without undue discomfort.  Vaginal brachytherapy procedure node: The patient was brought to the [place] suite. Identity was confirmed. All relevant records and images related to the planned course of therapy were reviewed. The patient freely provided informed written consent to proceed with treatment after reviewing the details related to the planned course of therapy. The consent form was witnessed and verified by the simulation staff. Then, the patient was set-up in a stable reproducible supine position for radiation therapy. A pelvic exam was performed documenting the vaginal cuff to be intact no palpable pelvic masses The patient's custom vaginal cylinder was placed in the proximal vagina. This was affixed to the CT/MR stabilization plate to prevent slippage. Patient tolerated the placement well.  Verification simulation note:  A fiducial marker was placed within the vaginal cylinder. An AP and lateral film was then obtained through the pelvis area. This documented accurate position of the vaginal cylinder for treatment.  HDR BRACHYTHERAPY TREATMENT  The remote afterloading device was affixed to the vaginal cylinder by catheter. Patient then proceeded to undergo her second high-dose-rate treatment directed at the proximal vagina. The patient was prescribed a dose of 6 gray to be delivered to the mucosal surface. Treatment length was 3 cm. Patient was treated with 1 channel using 7 dwell positions. Treatment time was 333.8 seconds. Iridium 192  was the high-dose-rate source for treatment. The patient tolerated the treatment well. After completion of her therapy, a radiation survey was performed documenting return of the iridium source into the GammaMed safe.   PLAN: The patient will return for her third HDR treatment on 02/27/16. ________________________________  Blair Promise, PhD, MD   This document serves as a record of services personally performed by Gery Pray, MD. It was created on his behalf by Darcus Austin, a trained medical scribe. The creation of this record is based on the scribe's personal observations and the provider's statements to them. This document has been checked and approved by the attending provider.

## 2016-02-27 ENCOUNTER — Ambulatory Visit
Admission: RE | Admit: 2016-02-27 | Discharge: 2016-02-27 | Disposition: A | Discharge status: Home / Self Care | Payer: Medicare Other | Source: Ambulatory Visit | Attending: Radiation Oncology | Admitting: Radiation Oncology

## 2016-02-27 DIAGNOSIS — C541 Malignant neoplasm of endometrium: Secondary | ICD-10-CM

## 2016-02-27 DIAGNOSIS — Z510 Encounter for antineoplastic radiation therapy: Secondary | ICD-10-CM | POA: Diagnosis not present

## 2016-02-27 NOTE — Progress Notes (Signed)
  Radiation Oncology         [phone] ________________________________  Name: Cindy Anderson MRN: [id number]  Date: 02/27/2016  DOB: 25-Aug-1945  CC: [facility]  Dorothyann Gibbs, NP  HDR BRACHYTHERAPY NOTE  DIAGNOSIS: Stage IIIC-2 Grade 3 endometrial carcinoma   Simple treatment device note: Patient had construction of her custom vaginal cylinder. She will be treated with a 3 cm diameter segmented cylinder. This conforms to her anatomy without undue discomfort.  Vaginal brachytherapy procedure node: The patient was brought to the [place] suite. Identity was confirmed. All relevant records and images related to the planned course of therapy were reviewed. The patient freely provided informed written consent to proceed with treatment after reviewing the details related to the planned course of therapy. The consent form was witnessed and verified by the simulation staff. Then, the patient was set-up in a stable reproducible supine position for radiation therapy. A pelvic exam was performed documenting the vaginal cuff to be intact no palpable pelvic masses The patient's custom vaginal cylinder was placed in the proximal vagina. This was affixed to the CT/MR stabilization plate to prevent slippage. Patient tolerated the placement well.  Verification simulation note:  A fiducial marker was placed within the vaginal cylinder. An AP and lateral film was then obtained through the pelvis area. This documented accurate position of the vaginal cylinder for treatment.  HDR BRACHYTHERAPY TREATMENT  The remote afterloading device was affixed to the vaginal cylinder by catheter. Patient then proceeded to undergo her third high-dose-rate treatment directed at the proximal vagina. The patient was prescribed a dose of 6 gray to be delivered to the mucosal surface. Treatment length was 3 cm. Patient was treated with 1 channel using 7 dwell positions. Treatment time was 356.3 seconds. Iridium 192 was  the high-dose-rate source for treatment. The patient tolerated the treatment well. After completion of her therapy, a radiation survey was performed documenting return of the iridium source into the GammaMed safe.   PLAN: The patient has completed her vaginal HDR brachytherapy treatments and will return to the clinic for a follow up in 1 month. ________________________________  Blair Promise, PhD, MD   This document serves as a record of services personally performed by Gery Pray, MD. It was created on his behalf by Darcus Austin, a trained medical scribe. The creation of this record is based on the scribe's personal observations and the provider's statements to them. This document has been checked and approved by the attending provider.

## 2016-04-08 ENCOUNTER — Encounter: Payer: Self-pay | Admitting: Oncology

## 2016-04-09 ENCOUNTER — Encounter: Payer: Self-pay | Admitting: Radiation Oncology

## 2016-04-09 ENCOUNTER — Ambulatory Visit
Admission: RE | Admit: 2016-04-09 | Discharge: 2016-04-09 | Disposition: A | Discharge status: Home / Self Care | Payer: Medicare Other | Source: Ambulatory Visit | Attending: Radiation Oncology | Admitting: Radiation Oncology

## 2016-04-09 VITALS — BP 163/65 | HR 72 | Temp 98.2°F | Ht 61.0 in | Wt 138.0 lb

## 2016-04-09 DIAGNOSIS — R21 Rash and other nonspecific skin eruption: Secondary | ICD-10-CM | POA: Insufficient documentation

## 2016-04-09 DIAGNOSIS — Y842 Radiological procedure and radiotherapy as the cause of abnormal reaction of the patient, or of later complication, without mention of misadventure at the time of the procedure: Secondary | ICD-10-CM | POA: Diagnosis not present

## 2016-04-09 DIAGNOSIS — Z923 Personal history of irradiation: Secondary | ICD-10-CM | POA: Diagnosis not present

## 2016-04-09 DIAGNOSIS — M7989 Other specified soft tissue disorders: Secondary | ICD-10-CM | POA: Insufficient documentation

## 2016-04-09 DIAGNOSIS — C541 Malignant neoplasm of endometrium: Secondary | ICD-10-CM | POA: Diagnosis present

## 2016-04-09 DIAGNOSIS — Z79899 Other long term (current) drug therapy: Secondary | ICD-10-CM | POA: Insufficient documentation

## 2016-04-09 NOTE — Progress Notes (Signed)
Radiation Oncology         [phone] ________________________________  Name: Cindy Anderson MRN: [id number]  Date: 04/09/2016  DOB: [birth year]    Follow-Up Visit Note  CC: [facility]  Dorothyann Gibbs, NP    ICD-9-CM ICD-10-CM   1. Endometrial cancer, FIGO stage IIIC2 ([place]) 182.0 C54.1     Diagnosis:   Stage IIIC-2 Grade 3 endometrial carcinoma  Interval Since Last Radiation:  6 weeks (02/13/2016 through 02/27/2016)  Narrative:  The patient returns today for routine follow-up after treatment to her pelvis. She reports having pain in her fingers and toes that started near the end of radiation. She also reports having some swelling in her ankles. She wrapped them with ace bandages and they are better. She reports joint pain and swelling in her right thumb. She reports having a rash again on her left neck that was itchy and is still slightly pink. This area was previously bright red and benadryl cream improved it. She does not know the cause of this rash. She denies having any bladder or bowel issues. She reports having occasional streaks of blood in her stool. She denies having any vaginal bleeding. She reports having a good energy level. She is not currently prescribed neurontin or gabapentin. She was given a size S and M vaginal dilator today. She last met with Dr. Fermin Schwab at [facility] on 03/26/2016.                               ALLERGIES:  has No Known Allergies.  Meds: Current Outpatient Prescriptions  Medication Sig Dispense Refill  . acetaminophen (TYLENOL) 325 MG tablet Take 650 mg by mouth.    . famotidine (PEPCID) 20 MG tablet Take 20 mg by mouth daily as needed.     . ferrous sulfate 325 (65 FE) MG tablet Take 325 mg by mouth daily with breakfast.     . ibuprofen (ADVIL,MOTRIN) 600 MG tablet Take 600 mg by mouth as needed. Reported on 10/09/2015    . magnesium chloride (SLOW-MAG) 64 MG TBEC SR tablet Take 71.5 mg by mouth 2 (two) times daily. Reported on  12/17/2015    . Multiple Vitamins-Minerals (CENTRUM SILVER) tablet Take 1 tablet by mouth daily.     Marland Kitchen pyridOXINE (VITAMIN B-6) 50 MG tablet Take 50 mg by mouth daily.    Marland Kitchen triamcinolone ointment (KENALOG) 0.1 % Apply topically. Reported on 11/13/2015    . vitamin C (ASCORBIC ACID) 500 MG tablet Take 500 mg by mouth daily.    Marland Kitchen docusate sodium (COLACE) 100 MG capsule Take 100 mg by mouth.    . hydrocortisone (ANUSOL-HC) 25 MG suppository Place 25 mg rectally 2 (two) times daily. [facility] Dr. Sondra Come on 01/14/16.    Marland Kitchen ondansetron (ZOFRAN-ODT) 4 MG disintegrating tablet Take 4 mg by mouth every 8 (eight) hours as needed for nausea or vomiting. Reported on 12/17/2015    . prochlorperazine (COMPAZINE) 10 MG tablet Take 10 mg by mouth every 6 (six) hours as needed for nausea or vomiting. Reported on 12/17/2015     No current facility-administered medications for this encounter.     Physical Findings: The patient is in no acute distress. Patient is alert and oriented.  height is '5\' 1"'  (1.549 m) and weight is 138 lb (62.6 kg). Her oral temperature is 98.2 F (36.8 C). Her blood pressure is 163/65 (abnormal) and her pulse is 72.  Her oxygen saturation is 98%. .  Lungs are clear to auscultation bilaterally. Heart has regular rate and rhythm. No palpable cervical, supraclavicular, or axillary adenopathy. Abdomen soft, non-tender, normal bowel sounds. Pelvic examination was not performed today as she recently had one with Dr. Rush Landmark on 03/26/2016.    Lab Findings: Lab Results  Component Value Date   WBC 9.7 06/21/2015   HGB 9.0 (L) 06/21/2015   HCT 28.6 (L) 06/21/2015   MCV 78.8 (L) 06/21/2015   PLT 898 (H) 06/21/2015    Radiographic Findings: No results found.  Impression:  The patient is recovering from the effects of radiation.  She underwent CT Chest, Abdomen, Pelvis at [facility] on 03/16/2016 which showed no evidence of recurrent or metastatic disease in the chest, abdomen, or  pelvis.  Plan:  She was given a vaginal dilator and was instructed on its use today. She is scheduled to follow up with Dr. Fermin Schwab at [facility] on 06/25/16. She will follow up in our clinic in April 2018.   -----------------------------------  Blair Promise, PhD, MD  This document serves as a record of services personally performed by Gery Pray, MD. It was created on his behalf by Arlyce Harman, a trained medical scribe. The creation of this record is based on the scribe's personal observations and the provider's statements to them. This document has been checked and approved by the attending provider.

## 2016-04-09 NOTE — Progress Notes (Signed)
  Home Care Instructions for the Insertion and Care of Your Vaginal Dilator  Why Do I Need a Vaginal Dilator?  Internal radiation therapy may cause scar tissue to form at the top of your vagina (vaginal cuff).  This may make vaginal examinations difficult in the future. You can prevent scar tissue from forming by using a vaginal dilator (a smooth plastic rod), and/or by having regular sexual intercourse.  If not using the dilator you should be having intercourse two or three times a week.  If you are unable to have intercourse, you should use your vaginal dilator.  You may have some spotting or bleeding from your dilator or intercourse the first few times. You may also have some discomfort. If discomfort occurs with intercourse, you and your partner may need to stop for a while and try again later.  How to Use Your Vaginal Dilator  - Wash the dilator with soap and water before and after each use. - Check the dilator to be sure it is smooth. Do not use the dilator if you find any roughspots. - Coat the dilator with K-Y Jelly, Astroglide, or Replens. Do not use Vaseline, baby oil, or other oil based lubricants. They are not water-soluble and can be irritating to the tissues in the vagina. - Lie on your back with your knees bent and legs apart. - Insert the rounded end of the dilator into your vagina as far as it will go without causing pain or discomfort. - Close your knees and slowly straighten your legs. - Keep the dilator in your vagina for about 10 to 15 minutes. [facility] your knees, open your legs, and gently remove the dilator. - Gently cleanse the skin around the vaginal opening. - Wash the dilator after each use. - Use the dilator three times a week for 10 minutes (for example: use on Monday, Wednesday and Friday). -  It is important that you use the dilator routinely until instructed otherwise by your doctor.

## 2016-04-09 NOTE — Progress Notes (Signed)
Cindy Anderson here for follow up after treatment to her pelvis.  She reports having pain in her fingers and toes that started started near the end of radiation.  She also reports having some swelling in her ankles.  She wrapped them with ace bandages and they are better.  She reports having a rash again on her left neck that was itchy.  It is still slightly pink.  She denies having any bladder or bowel issues.  She reports having occasional streaks of blood in her stool.  She denies having any vaginal bleeding.  She reports having a good energy level.  She has been given a size S and M vaginal dilator.  BP (!) 163/65 (BP Location: Right Arm, Patient Position: Sitting)   Pulse 72   Temp 98.2 F (36.8 C) (Oral)   Ht 5\' 1"  (1.549 m)   Wt 138 lb (62.6 kg)   SpO2 98%   BMI 26.07 kg/m    Wt Readings from Last 3 Encounters:  04/09/16 138 lb (62.6 kg)  02/13/16 137 lb 1.6 oz (62.2 kg)  01/28/16 135 lb 12.8 oz (61.6 kg)

## 2016-05-21 ENCOUNTER — Encounter: Payer: Self-pay | Admitting: Radiation Oncology

## 2016-05-21 NOTE — Progress Notes (Signed)
  Radiation Oncology         [phone] ________________________________  Name: Cindy Anderson MRN: [id number]  Date: 05/21/2016  DOB: [birth year]  End of Treatment Note  Diagnosis:   Stage IIIC-2Grade 3 endometrial carcinoma     Indication for treatment:  Curative     Radiation treatment dates:  12/26/15-01/29/16     02/13/16, 02/20/16, 02/27/16  Site/dose:   1) Pelvis/ 45 Gy in 25 fractions   2) Vaginal cuff/ 18 Gy in 3 fractions  Beams/energy:   1)  IMRT / 6X    2) HDR vaginal/ Iridium HDR  Narrative: The patient tolerated radiation treatment relatively well.   Patient reported dysuria and diarrhea during radiation.  Plan: The patient has completed radiation treatment. The patient will return to radiation oncology clinic for routine followup in one month. I advised them to call or return sooner if they have any questions or concerns related to their recovery or treatment.  -----------------------------------  Blair Promise, PhD, MD  This document serves as a record of services personally performed by Gery Pray, MD. It was created on his behalf by Bethann Humble, a trained medical scribe. The creation of this record is based on the scribe's personal observations and the provider's statements to them. This document has been checked and approved by the attending provider.

## 2016-07-02 DIAGNOSIS — Z79899 Other long term (current) drug therapy: Secondary | ICD-10-CM | POA: Diagnosis not present

## 2016-07-02 DIAGNOSIS — C541 Malignant neoplasm of endometrium: Secondary | ICD-10-CM | POA: Diagnosis not present

## 2016-07-02 DIAGNOSIS — D649 Anemia, unspecified: Secondary | ICD-10-CM | POA: Diagnosis not present

## 2016-07-02 DIAGNOSIS — Z923 Personal history of irradiation: Secondary | ICD-10-CM | POA: Diagnosis not present

## 2016-07-02 DIAGNOSIS — Z9221 Personal history of antineoplastic chemotherapy: Secondary | ICD-10-CM | POA: Diagnosis not present

## 2016-07-02 DIAGNOSIS — I10 Essential (primary) hypertension: Secondary | ICD-10-CM | POA: Diagnosis not present

## 2016-07-02 DIAGNOSIS — Z90710 Acquired absence of both cervix and uterus: Secondary | ICD-10-CM | POA: Diagnosis not present

## 2016-08-11 DIAGNOSIS — H40053 Ocular hypertension, bilateral: Secondary | ICD-10-CM | POA: Diagnosis not present

## 2016-09-04 DIAGNOSIS — Z79899 Other long term (current) drug therapy: Secondary | ICD-10-CM | POA: Diagnosis not present

## 2016-09-04 DIAGNOSIS — D125 Benign neoplasm of sigmoid colon: Secondary | ICD-10-CM | POA: Diagnosis not present

## 2016-09-04 DIAGNOSIS — I10 Essential (primary) hypertension: Secondary | ICD-10-CM | POA: Diagnosis not present

## 2016-09-04 DIAGNOSIS — Z1211 Encounter for screening for malignant neoplasm of colon: Secondary | ICD-10-CM | POA: Diagnosis not present

## 2016-09-04 DIAGNOSIS — K648 Other hemorrhoids: Secondary | ICD-10-CM | POA: Diagnosis not present

## 2016-09-04 DIAGNOSIS — Z86010 Personal history of colonic polyps: Secondary | ICD-10-CM | POA: Diagnosis not present

## 2016-09-04 DIAGNOSIS — K219 Gastro-esophageal reflux disease without esophagitis: Secondary | ICD-10-CM | POA: Diagnosis not present

## 2016-09-04 DIAGNOSIS — I82409 Acute embolism and thrombosis of unspecified deep veins of unspecified lower extremity: Secondary | ICD-10-CM | POA: Diagnosis not present

## 2016-09-10 ENCOUNTER — Ambulatory Visit: Payer: Self-pay | Admitting: Radiation Oncology

## 2016-09-23 ENCOUNTER — Encounter: Payer: Self-pay | Admitting: Oncology

## 2016-09-23 ENCOUNTER — Ambulatory Visit
Admission: RE | Admit: 2016-09-23 | Discharge: 2016-09-23 | Disposition: A | Discharge status: Home / Self Care | Payer: Medicare Other | Source: Ambulatory Visit | Attending: Radiation Oncology | Admitting: Radiation Oncology

## 2016-09-23 VITALS — BP 186/53 | HR 66 | Temp 98.0°F | Ht 61.0 in | Wt 138.4 lb

## 2016-09-23 DIAGNOSIS — Z8542 Personal history of malignant neoplasm of other parts of uterus: Secondary | ICD-10-CM | POA: Diagnosis not present

## 2016-09-23 DIAGNOSIS — Z08 Encounter for follow-up examination after completed treatment for malignant neoplasm: Secondary | ICD-10-CM | POA: Diagnosis not present

## 2016-09-23 DIAGNOSIS — Z79899 Other long term (current) drug therapy: Secondary | ICD-10-CM | POA: Insufficient documentation

## 2016-09-23 DIAGNOSIS — M545 Low back pain: Secondary | ICD-10-CM | POA: Diagnosis not present

## 2016-09-23 DIAGNOSIS — R3915 Urgency of urination: Secondary | ICD-10-CM | POA: Diagnosis not present

## 2016-09-23 DIAGNOSIS — C541 Malignant neoplasm of endometrium: Secondary | ICD-10-CM | POA: Insufficient documentation

## 2016-09-23 NOTE — Progress Notes (Signed)
Cindy Anderson is here for follow up.  Cindy Anderson reports having lower back pain for the past 2 weeks.  Cindy Anderson said Cindy Anderson thought Cindy Anderson injured it sweeping and the pain has not gone away.  Cindy Anderson reports having urinary urgency.  Cindy Anderson denies having any bowel issues and had a colonoscopy on March 30 and 2 polyps removed.  Cindy Anderson reports seeing a "pink tinge" after using her dilator the last few times.  Cindy Anderson denies having fatigue and has a good appetite.  BP (!) 186/53 (BP Location: Right Arm, Patient Position: Sitting)   Pulse 66   Temp 98 F (36.7 C) (Oral)   Ht 5\' 1"  (1.549 m)   Wt 138 lb 6.4 oz (62.8 kg)   SpO2 99%   BMI 26.15 kg/m    Wt Readings from Last 3 Encounters:  09/23/16 138 lb 6.4 oz (62.8 kg)  04/09/16 138 lb (62.6 kg)  02/13/16 137 lb 1.6 oz (62.2 kg)

## 2016-09-23 NOTE — Progress Notes (Signed)
Radiation Oncology         [phone] ________________________________  Name: Cindy Anderson MRN: [id number]  Date: 09/23/2016  DOB: 11-Dec-1945    Follow-Up Visit Note  CC: [facility]  Dorothyann Gibbs, NP    ICD-9-CM ICD-10-CM   1. Endometrial cancer, FIGO stage IIIC2 ([place]) 182.0 C54.1     Diagnosis:   Stage IIIC-2 Grade 3 endometrial carcinoma  Interval Since Last Radiation:  7 months 02/13/2016-02/27/2016: 45 Gy to the pelvis/para-aortic nodes+ 18 Gy to the vaginal cuff  Narrative:  The patient returns today for routine follow-up after treatment to her pelvis. She reports lower back pain for the past 2 weeks that she injured while sweeping. This is aggravated with movement. She reports urinary urgency. She denies bowl issues. She notes she had a colonoscopy on 09/04/16 and had 2 polys removed. She notes a "pink tinge" after using her dilator. She denies fatigue and reports a good appetite.                            ALLERGIES:  has No Known Allergies.  Meds: Current Outpatient Prescriptions  Medication Sig Dispense Refill  . acetaminophen (TYLENOL) 325 MG tablet Take 650 mg by mouth.    . docusate sodium (COLACE) 100 MG capsule Take 100 mg by mouth.    . famotidine (PEPCID) 20 MG tablet Take 20 mg by mouth daily as needed.     Marland Kitchen ibuprofen (ADVIL,MOTRIN) 600 MG tablet Take 600 mg by mouth as needed. Reported on 10/09/2015    . magnesium chloride (SLOW-MAG) 64 MG TBEC SR tablet Take 71.5 mg by mouth 2 (two) times daily. Reported on 12/17/2015    . Multiple Vitamins-Minerals (CENTRUM SILVER) tablet Take 1 tablet by mouth daily.     Marland Kitchen pyridOXINE (VITAMIN B-6) 50 MG tablet Take 50 mg by mouth daily.    . vitamin C (ASCORBIC ACID) 500 MG tablet Take 500 mg by mouth daily.     No current facility-administered medications for this encounter.     Physical Findings: The patient is in no acute distress. Patient is alert and oriented.  height is 5\' 1"  (1.549 m) and  weight is 138 lb 6.4 oz (62.8 kg). Her oral temperature is 98 F (36.7 C). Her blood pressure is 186/53 (abnormal) and her pulse is 66. Her oxygen saturation is 99%. .  Lungs are clear to auscultation bilaterally. Heart has regular rate and rhythm. No palpable cervical, supraclavicular, or axillary adenopathy. Abdomen soft, non-tender, normal bowel sounds. Pelvic exam was deferred in light of her upcoming visit with Dr. Fermin Schwab.   Lab Findings: Lab Results  Component Value Date   WBC 9.7 06/21/2015   HGB 9.0 (L) 06/21/2015   HCT 28.6 (L) 06/21/2015   MCV 78.8 (L) 06/21/2015   PLT 898 (H) 06/21/2015    Radiographic Findings: No results found.  Impression:   Clinically, patient is doing well. She reports lower back pain likely from a muscle strain.  Plan: She is scheduled to follow up with Dr. Fermin Schwab at [facility] on 10/01/16. She will have a pelvic exam at that time. She will discuss with Dr. Fermin Schwab whether she would like to have alternating appointments with radiation oncology or solely with Gyn oncology.  -----------------------------------  Blair Promise, PhD, MD  This document serves as a record of services personally performed by Gery Pray, MD. It was created on his behalf by Bethann Humble, a trained  medical scribe. The creation of this record is based on the scribe's personal observations and the provider's statements to them. This document has been checked and approved by the attending provider.

## 2016-10-01 DIAGNOSIS — Z90722 Acquired absence of ovaries, bilateral: Secondary | ICD-10-CM | POA: Diagnosis not present

## 2016-10-01 DIAGNOSIS — Z8249 Family history of ischemic heart disease and other diseases of the circulatory system: Secondary | ICD-10-CM | POA: Diagnosis not present

## 2016-10-01 DIAGNOSIS — Z90710 Acquired absence of both cervix and uterus: Secondary | ICD-10-CM | POA: Diagnosis not present

## 2016-10-01 DIAGNOSIS — C541 Malignant neoplasm of endometrium: Secondary | ICD-10-CM | POA: Diagnosis not present

## 2016-10-01 DIAGNOSIS — I10 Essential (primary) hypertension: Secondary | ICD-10-CM | POA: Diagnosis not present

## 2016-10-01 DIAGNOSIS — Z9221 Personal history of antineoplastic chemotherapy: Secondary | ICD-10-CM | POA: Diagnosis not present

## 2016-10-01 DIAGNOSIS — D126 Benign neoplasm of colon, unspecified: Secondary | ICD-10-CM | POA: Diagnosis not present

## 2016-10-01 DIAGNOSIS — N939 Abnormal uterine and vaginal bleeding, unspecified: Secondary | ICD-10-CM | POA: Diagnosis not present

## 2016-10-01 DIAGNOSIS — Z803 Family history of malignant neoplasm of breast: Secondary | ICD-10-CM | POA: Diagnosis not present

## 2016-10-01 DIAGNOSIS — Z6826 Body mass index (BMI) 26.0-26.9, adult: Secondary | ICD-10-CM | POA: Diagnosis not present

## 2016-10-01 DIAGNOSIS — N858 Other specified noninflammatory disorders of uterus: Secondary | ICD-10-CM | POA: Diagnosis not present

## 2016-10-01 DIAGNOSIS — S3141XA Laceration without foreign body of vagina and vulva, initial encounter: Secondary | ICD-10-CM | POA: Diagnosis not present

## 2016-10-01 DIAGNOSIS — Z923 Personal history of irradiation: Secondary | ICD-10-CM | POA: Diagnosis not present

## 2016-12-19 IMAGING — CT CT CHEST W/ CM
2 of 5 series · 13 of 46 positions shown, 15 images · IV contrast (OMNIPAQUE 300)
Comparison: None.

CLINICAL DATA: Weakness and shortness of breath.

EXAM:
CT CHEST, ABDOMEN, AND PELVIS WITH CONTRAST
TECHNIQUE: Multidetector CT imaging of the chest, abdomen and pelvis was
performed following the standard protocol during bolus
administration of intravenous contrast.
CONTRAST:  25mL OMNIPAQUE IOHEXOL 300 MG/ML SOLN, 100mL OMNIPAQUE
IOHEXOL 300 MG/ML SOLN

[Series 2: abd/pel with · axial · 0.69mm/px · z∈[-388,+168]mm · 10 of 129 slices shown, 12 images]
[im 9/129  soft-tissue]
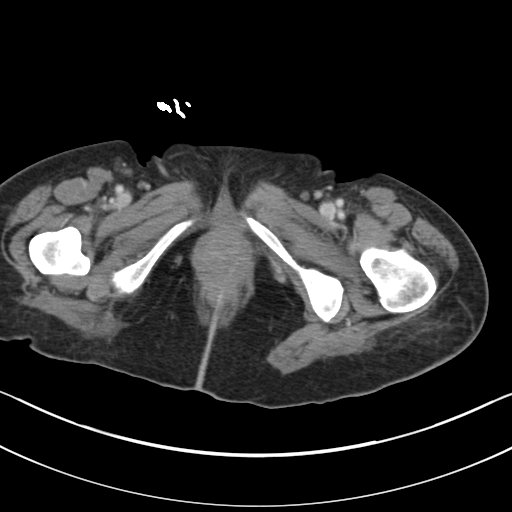
[im 9/129  bone]
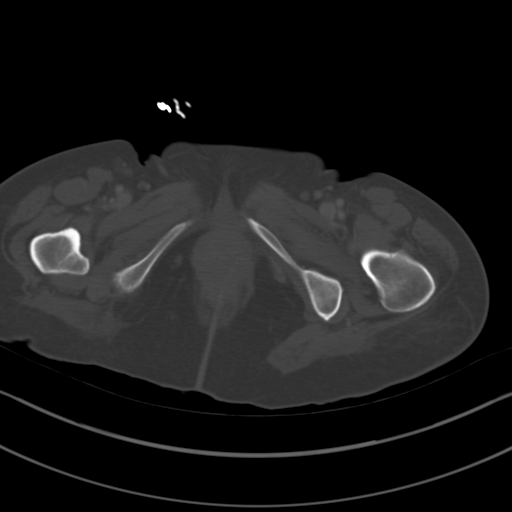
[im 26/129  soft-tissue]
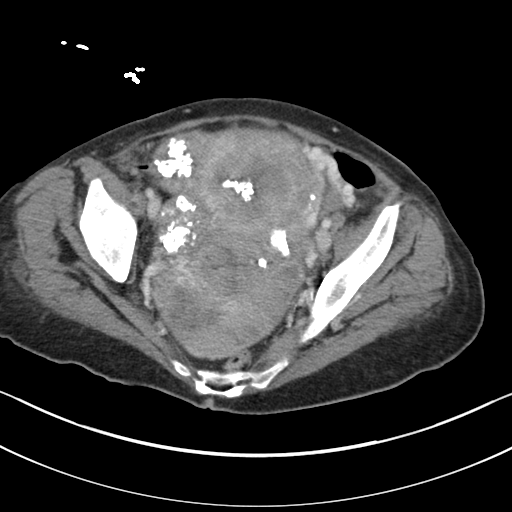
[im 35/129  soft-tissue]
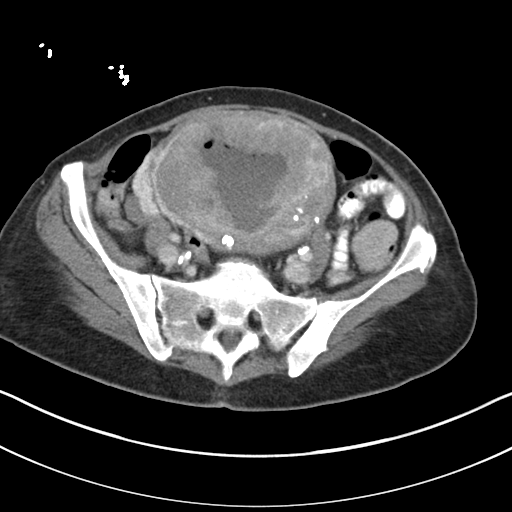
[im 43/129  soft-tissue]
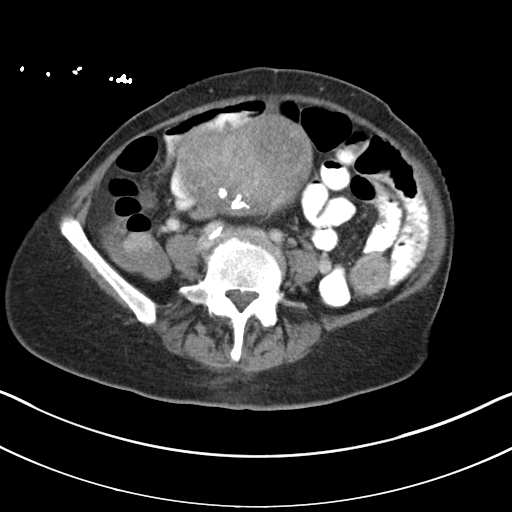
[im 60/129  soft-tissue]
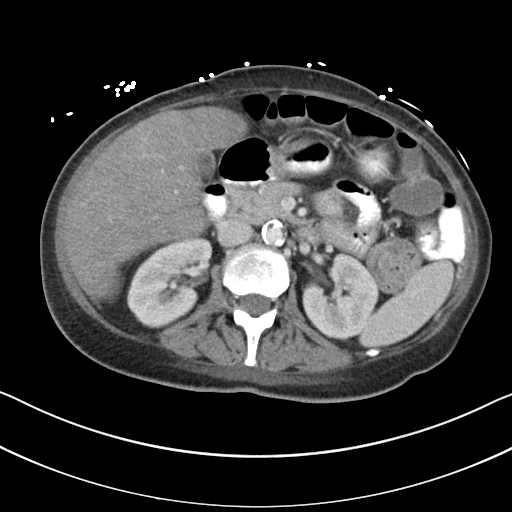
[im 69/129  soft-tissue]
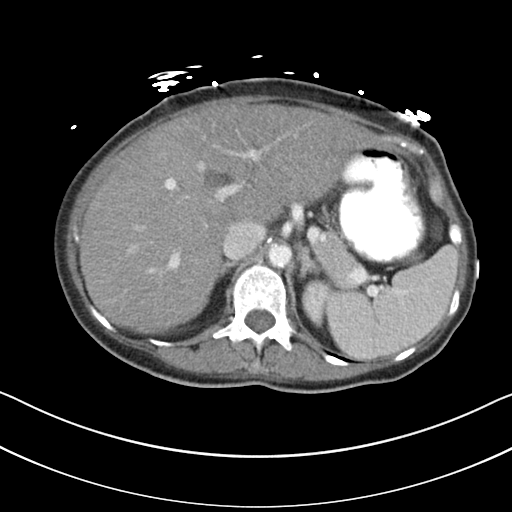
[im 86/129  soft-tissue]
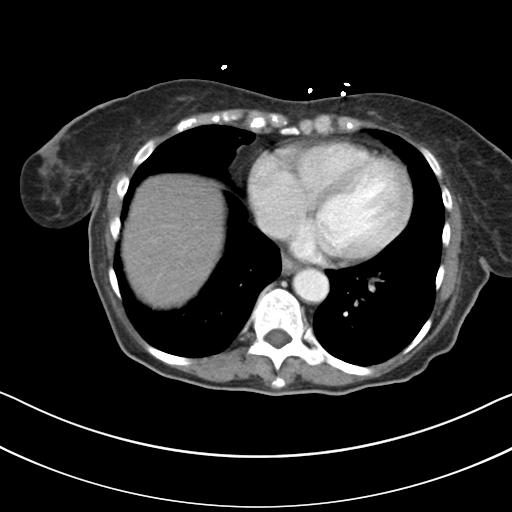
[im 94/129  soft-tissue]
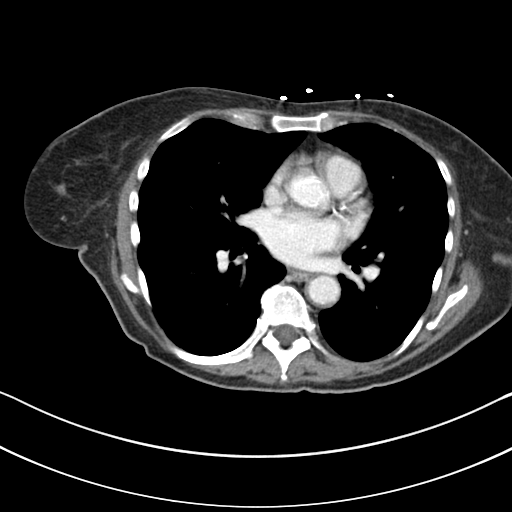
[im 103/129  soft-tissue]
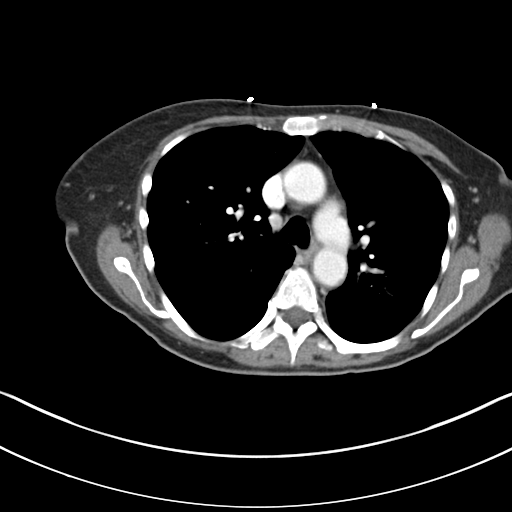
[im 103/129  bone]
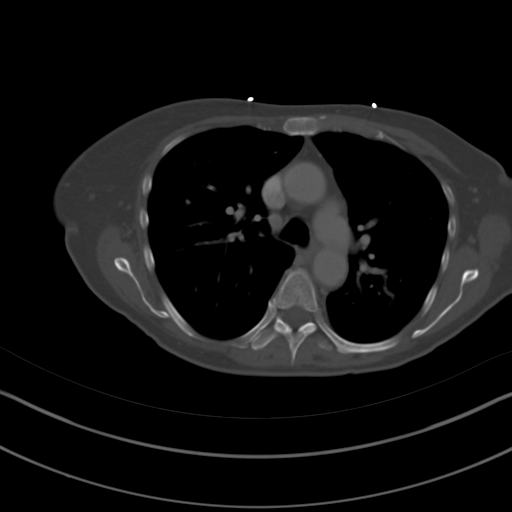
[im 120/129  soft-tissue]
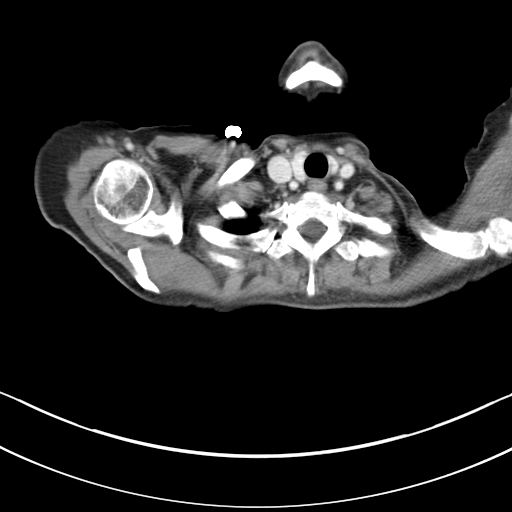

[Series 3: coronal a/|p · coronal · 0.66mm/px · 3 of 87 slices shown]
[im 29/87  soft-tissue]
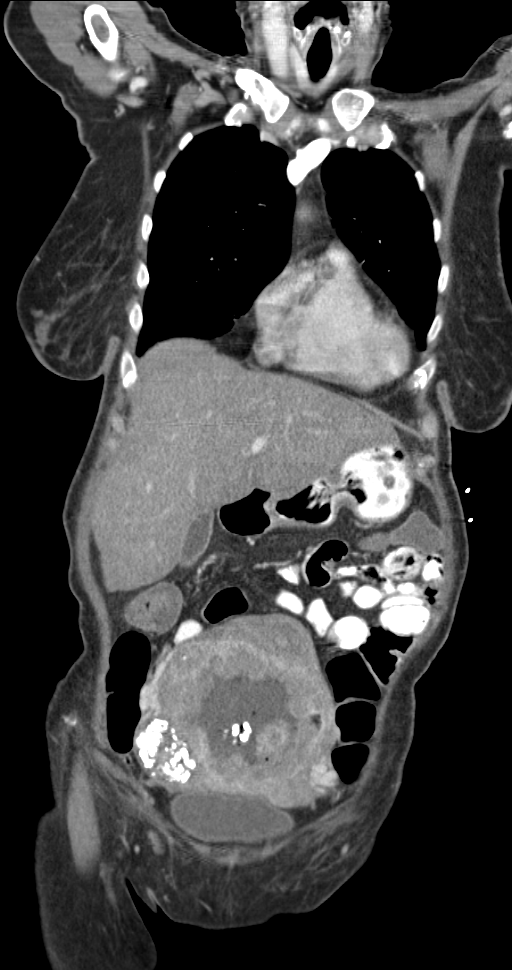
[im 39/87  soft-tissue]
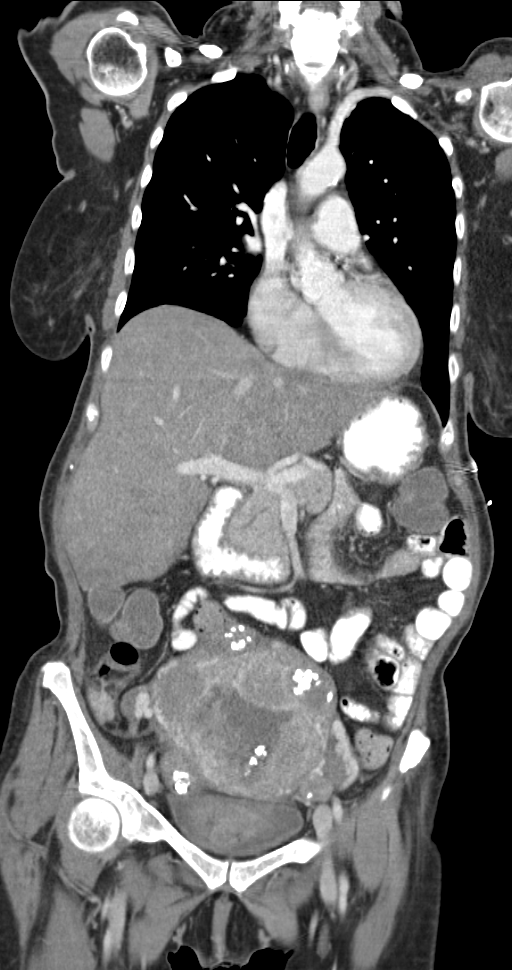
[im 48/87  soft-tissue]
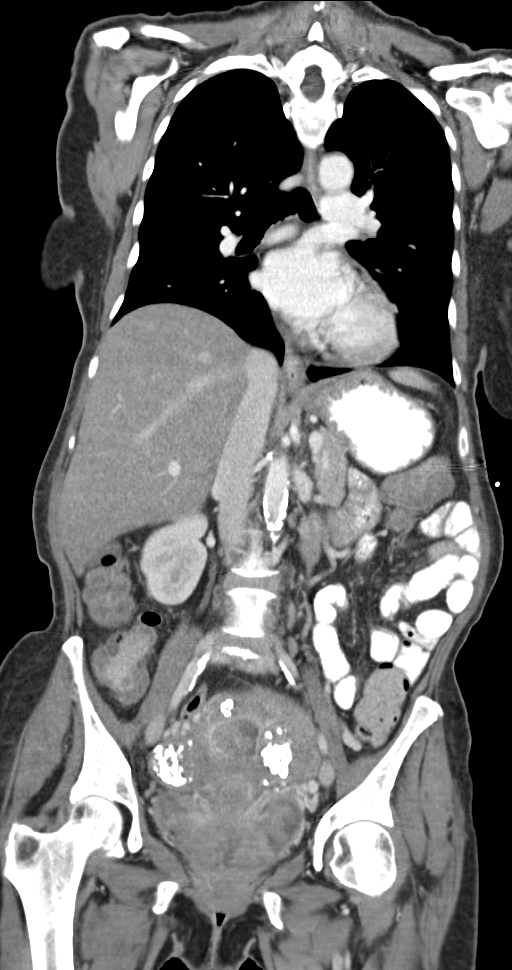

[13 of 46 positions shown; findings below may reference images not displayed]

FINDINGS: CT CHEST FINDINGS

Mediastinum/Nodes: Normal heart size. Aortic atherosclerosis noted.
Calcification within the LAD and left circumflex coronary artery
noted. The trachea appears patent and is midline. Normal appearance
of the esophagus. No supraclavicular or axillary adenopathy. There
is no mediastinal or hilar adenopathy.

Lungs/Pleura: No pleural fluid identified. No airspace consolidation
or atelectasis present. No suspicious nodules or mass is identified
within the lungs.

Musculoskeletal: No aggressive lytic or sclerotic bone lesions. Mild
curvature of the thoracic spine is noted. The thoracic vertebral
bodies are well preserved. Mild degenerative disc disease noted. No
aggressive lytic or sclerotic bone lesions identified.

CT ABDOMEN PELVIS FINDINGS

Hepatobiliary: No suspicious liver abnormalities identified. The
gallbladder appears normal. There is no biliary dilatation.

Pancreas: The pancreas is normal.

Spleen: Negative.

Adrenals/Urinary Tract: The adrenal glands are negative. Normal
appearance of the kidneys. Urinary bladder appears within normal
limits.

Stomach/Bowel: The stomach appears normal. The small bowel loops
have a normal caliber. There is no pathologic dilatation of the
large or small bowel loops.

Vascular/Lymphatic: Calcified atherosclerotic disease involves the
abdominal aorta. No aneurysm. No enlarged retroperitoneal or
mesenteric adenopathy. No enlarged pelvic or inguinal lymph nodes.

Reproductive: Markedly enlarged fibroid uterus is identified. There
are multiple fibroids scratch set multiple calcified and
noncalcified fibroids are identified. The uterus measures 15.5 x
13.2 by 17.7 cm. The largest fibroid arises from the uterine fundus
and measures 9.5 cm. This appears to of undergone central
degeneration. There is gas within this degenerating fibroid, image
number 28 of series 3. Within the lower uterine segment there is a
large fibroid measuring 10.5 x 10.7 x 11.0 cm. This also appears to
contain multiple foci of gas.

Other: No free fluid identified. No abnormal fluid collections
identified.

Musculoskeletal: No aggressive lytic or sclerotic bone lesion
identified.
IMPRESSION: 1. No acute findings identified within the abdomen or pelvis.
2. Markedly enlarged fibroid uterus. Several of these fibroids have
undergone internal degeneration and now contain gas. In a patient
who has anemia and abnormal weight loss the possibility of malignant
degeneration of 1 or more of these fibroids should be considered. No
findings to suggest metastatic disease.
3. Aortic atherosclerosis.

## 2016-12-22 IMAGING — RF DG ESOPHAGUS
3 series · 3 of 3 positions shown · non-contrast
Comparison: Report of recent swallow function study available.

CLINICAL DATA: 69-year-old female with dysphagia and Phiki.
Subsequent encounter.

EXAM:
ESOPHOGRAM/BARIUM SWALLOW
TECHNIQUE: Combined double contrast and single contrast examination performed
using effervescent crystals, thick barium liquid, and thin barium
liquid.
FLUOROSCOPY TIME:  Radiation Exposure Index (as provided by the
fluoroscopic device): 31.2 micro [place]
Fluoroscopy Time:  3 minutes and 8 seconds.

[Series 1: run · 1 of 1 slices shown (1 of 3)]
[im 1/1]
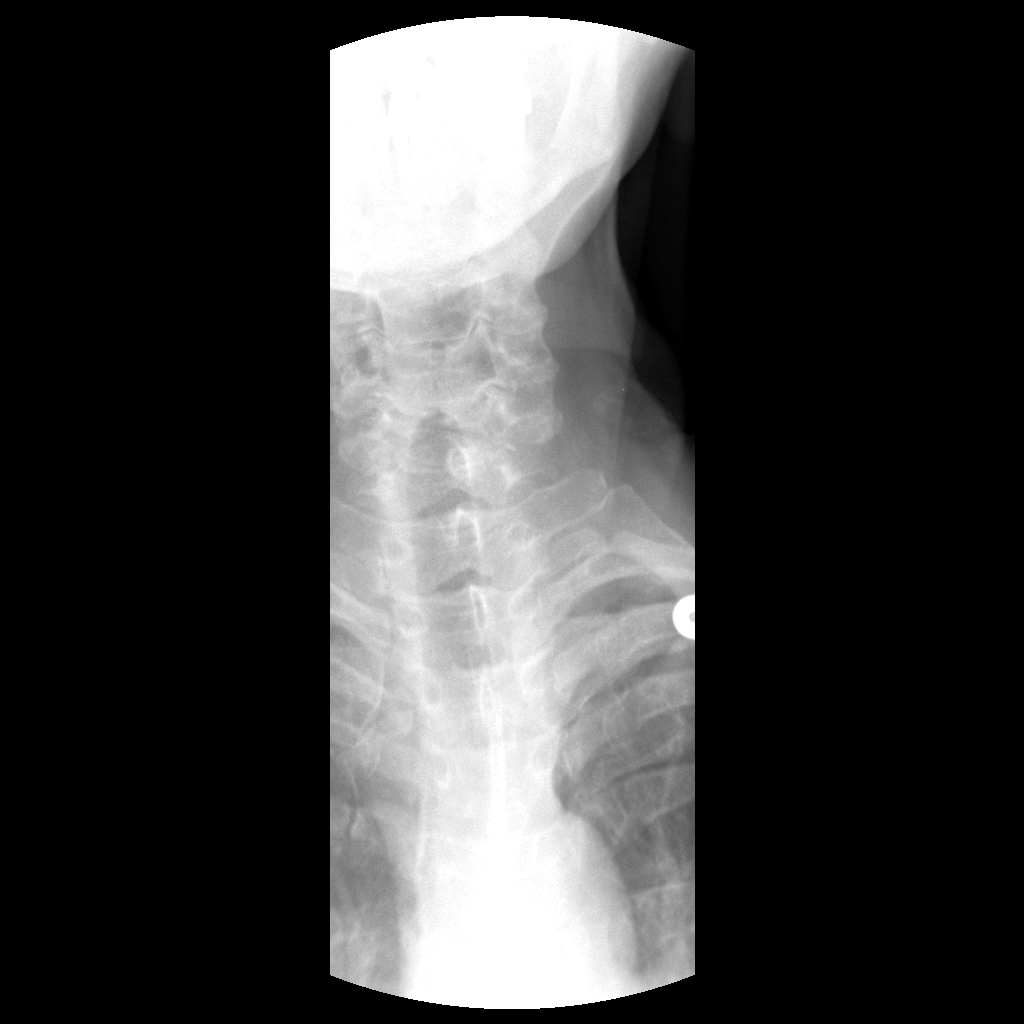

[Series 2: run · 1 of 1 slices shown (2 of 3)]
[im 1/1]
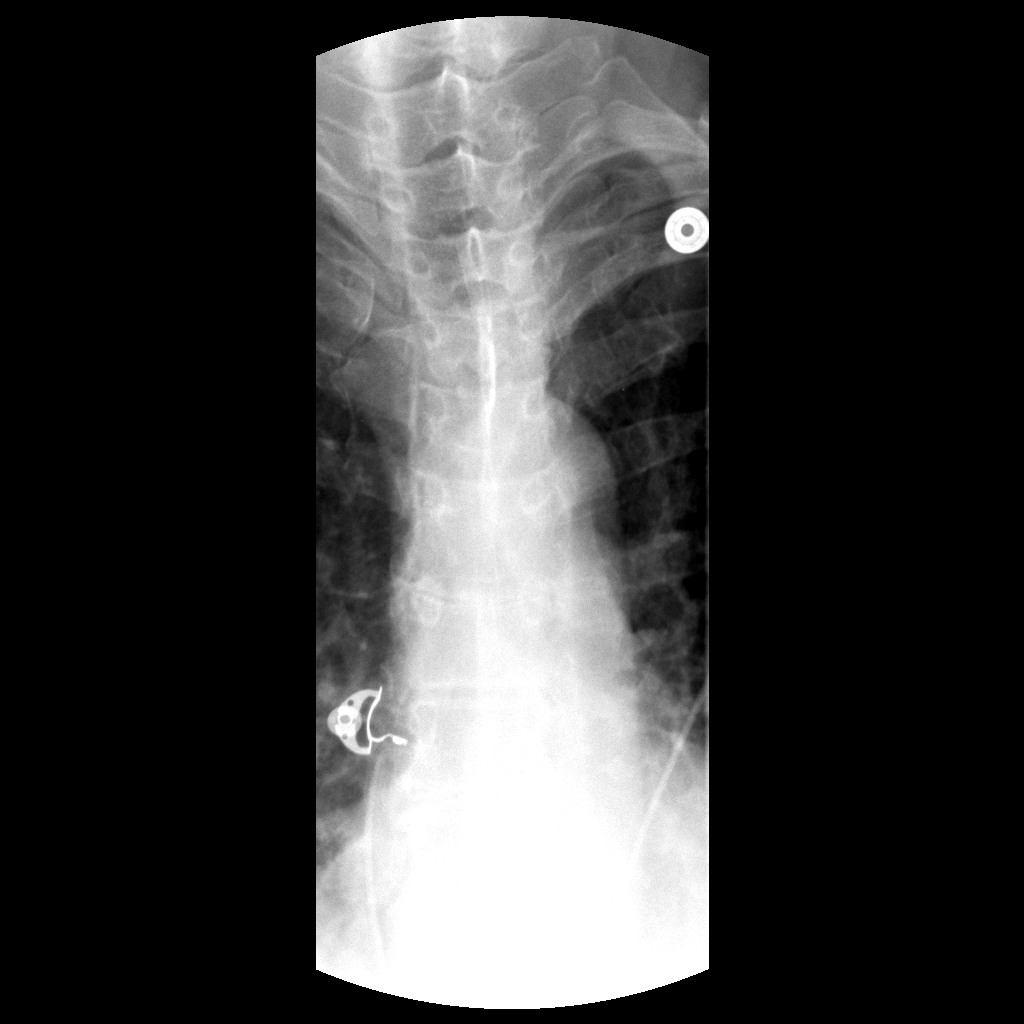

[Series 3: run · 1 of 1 slices shown (3 of 3)]
[im 1/1]
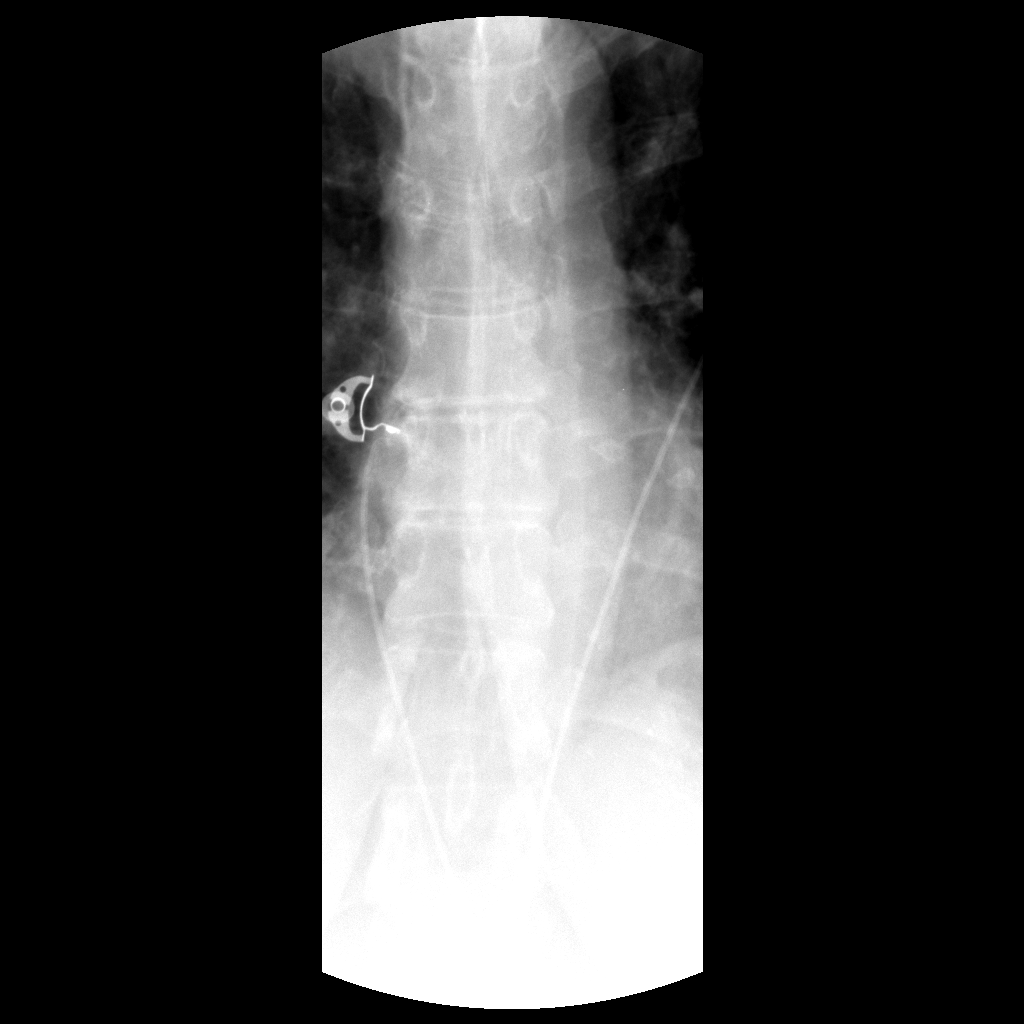

[3 of 3 positions shown; findings below may reference images not displayed]

FINDINGS: When the patient ingested a 13 mm barium tablet, this became lodged
in the lower cervical esophagus where there is smooth narrowing.
Barium tablet cleared when it was partially dissolved.

Normal primary esophageal stripping wave. No focal mucosal
abnormality noted.

Mild spontaneous gastroesophageal reflux into the region of the
distal esophagus.

Very small sliding-type hiatal hernia.
IMPRESSION: 13 mm barium tablet became lodged in the lower cervical esophagus
where there is smooth narrowing. Barium tablet only cleared when it
was partially dissolved.

Normal primary esophageal stripping wave.

No focal mucosal abnormality noted.

Mild spontaneous gastroesophageal reflux into the region of the
distal esophagus.

Very small sliding-type hiatal hernia.

## 2016-12-31 DIAGNOSIS — D649 Anemia, unspecified: Secondary | ICD-10-CM | POA: Diagnosis not present

## 2016-12-31 DIAGNOSIS — M5440 Lumbago with sciatica, unspecified side: Secondary | ICD-10-CM | POA: Diagnosis not present

## 2016-12-31 DIAGNOSIS — Z6827 Body mass index (BMI) 27.0-27.9, adult: Secondary | ICD-10-CM | POA: Diagnosis not present

## 2016-12-31 DIAGNOSIS — C541 Malignant neoplasm of endometrium: Secondary | ICD-10-CM | POA: Diagnosis not present

## 2016-12-31 DIAGNOSIS — N858 Other specified noninflammatory disorders of uterus: Secondary | ICD-10-CM | POA: Diagnosis not present

## 2016-12-31 DIAGNOSIS — Z90710 Acquired absence of both cervix and uterus: Secondary | ICD-10-CM | POA: Diagnosis not present

## 2016-12-31 DIAGNOSIS — C539 Malignant neoplasm of cervix uteri, unspecified: Secondary | ICD-10-CM | POA: Diagnosis not present

## 2016-12-31 DIAGNOSIS — N939 Abnormal uterine and vaginal bleeding, unspecified: Secondary | ICD-10-CM | POA: Diagnosis not present

## 2016-12-31 DIAGNOSIS — R188 Other ascites: Secondary | ICD-10-CM | POA: Diagnosis not present

## 2016-12-31 DIAGNOSIS — M545 Low back pain: Secondary | ICD-10-CM | POA: Diagnosis not present

## 2016-12-31 DIAGNOSIS — D259 Leiomyoma of uterus, unspecified: Secondary | ICD-10-CM | POA: Diagnosis not present

## 2016-12-31 DIAGNOSIS — Z90722 Acquired absence of ovaries, bilateral: Secondary | ICD-10-CM | POA: Diagnosis not present

## 2016-12-31 DIAGNOSIS — I10 Essential (primary) hypertension: Secondary | ICD-10-CM | POA: Diagnosis not present

## 2016-12-31 DIAGNOSIS — Z923 Personal history of irradiation: Secondary | ICD-10-CM | POA: Diagnosis not present

## 2016-12-31 DIAGNOSIS — R590 Localized enlarged lymph nodes: Secondary | ICD-10-CM | POA: Diagnosis not present

## 2017-04-01 DIAGNOSIS — Z9221 Personal history of antineoplastic chemotherapy: Secondary | ICD-10-CM | POA: Diagnosis not present

## 2017-04-01 DIAGNOSIS — Z923 Personal history of irradiation: Secondary | ICD-10-CM | POA: Diagnosis not present

## 2017-04-01 DIAGNOSIS — Z90710 Acquired absence of both cervix and uterus: Secondary | ICD-10-CM | POA: Diagnosis not present

## 2017-04-01 DIAGNOSIS — I10 Essential (primary) hypertension: Secondary | ICD-10-CM | POA: Diagnosis not present

## 2017-04-01 DIAGNOSIS — C541 Malignant neoplasm of endometrium: Secondary | ICD-10-CM | POA: Diagnosis not present

## 2017-04-01 DIAGNOSIS — Z90722 Acquired absence of ovaries, bilateral: Secondary | ICD-10-CM | POA: Diagnosis not present

## 2017-04-01 DIAGNOSIS — Z803 Family history of malignant neoplasm of breast: Secondary | ICD-10-CM | POA: Diagnosis not present

## 2017-04-01 DIAGNOSIS — I2510 Atherosclerotic heart disease of native coronary artery without angina pectoris: Secondary | ICD-10-CM | POA: Diagnosis not present

## 2017-04-01 DIAGNOSIS — Z6827 Body mass index (BMI) 27.0-27.9, adult: Secondary | ICD-10-CM | POA: Diagnosis not present

## 2017-04-01 DIAGNOSIS — K219 Gastro-esophageal reflux disease without esophagitis: Secondary | ICD-10-CM | POA: Diagnosis not present

## 2017-04-01 DIAGNOSIS — Z86718 Personal history of other venous thrombosis and embolism: Secondary | ICD-10-CM | POA: Diagnosis not present

## 2017-06-30 DIAGNOSIS — H40053 Ocular hypertension, bilateral: Secondary | ICD-10-CM | POA: Diagnosis not present

## 2017-07-01 DIAGNOSIS — C541 Malignant neoplasm of endometrium: Secondary | ICD-10-CM | POA: Diagnosis not present

## 2017-07-01 DIAGNOSIS — Z923 Personal history of irradiation: Secondary | ICD-10-CM | POA: Diagnosis not present

## 2017-07-01 DIAGNOSIS — C539 Malignant neoplasm of cervix uteri, unspecified: Secondary | ICD-10-CM | POA: Diagnosis not present

## 2017-07-01 DIAGNOSIS — Z803 Family history of malignant neoplasm of breast: Secondary | ICD-10-CM | POA: Diagnosis not present

## 2017-07-01 DIAGNOSIS — I10 Essential (primary) hypertension: Secondary | ICD-10-CM | POA: Diagnosis not present

## 2017-07-01 DIAGNOSIS — Z9221 Personal history of antineoplastic chemotherapy: Secondary | ICD-10-CM | POA: Diagnosis not present

## 2017-07-01 DIAGNOSIS — K439 Ventral hernia without obstruction or gangrene: Secondary | ICD-10-CM | POA: Diagnosis not present

## 2017-07-01 DIAGNOSIS — Z90722 Acquired absence of ovaries, bilateral: Secondary | ICD-10-CM | POA: Diagnosis not present

## 2017-07-01 DIAGNOSIS — Z6827 Body mass index (BMI) 27.0-27.9, adult: Secondary | ICD-10-CM | POA: Diagnosis not present

## 2017-07-01 DIAGNOSIS — K219 Gastro-esophageal reflux disease without esophagitis: Secondary | ICD-10-CM | POA: Diagnosis not present

## 2017-07-01 DIAGNOSIS — Z90710 Acquired absence of both cervix and uterus: Secondary | ICD-10-CM | POA: Diagnosis not present

## 2017-12-02 DIAGNOSIS — I10 Essential (primary) hypertension: Secondary | ICD-10-CM | POA: Diagnosis not present

## 2017-12-02 DIAGNOSIS — Z9221 Personal history of antineoplastic chemotherapy: Secondary | ICD-10-CM | POA: Diagnosis not present

## 2017-12-02 DIAGNOSIS — Z6828 Body mass index (BMI) 28.0-28.9, adult: Secondary | ICD-10-CM | POA: Diagnosis not present

## 2017-12-02 DIAGNOSIS — C541 Malignant neoplasm of endometrium: Secondary | ICD-10-CM | POA: Diagnosis not present

## 2017-12-02 DIAGNOSIS — Z923 Personal history of irradiation: Secondary | ICD-10-CM | POA: Diagnosis not present

## 2017-12-29 DIAGNOSIS — H40053 Ocular hypertension, bilateral: Secondary | ICD-10-CM | POA: Diagnosis not present

## 2018-04-06 DIAGNOSIS — H5203 Hypermetropia, bilateral: Secondary | ICD-10-CM | POA: Diagnosis not present

## 2018-04-06 DIAGNOSIS — H40053 Ocular hypertension, bilateral: Secondary | ICD-10-CM | POA: Diagnosis not present

## 2018-06-09 DIAGNOSIS — Z6828 Body mass index (BMI) 28.0-28.9, adult: Secondary | ICD-10-CM | POA: Diagnosis not present

## 2018-06-09 DIAGNOSIS — C541 Malignant neoplasm of endometrium: Secondary | ICD-10-CM | POA: Diagnosis not present

## 2018-06-16 ENCOUNTER — Ambulatory Visit (HOSPITAL_COMMUNITY)
Admission: EM | Admit: 2018-06-16 | Discharge: 2018-06-16 | Disposition: A | Discharge status: Home / Self Care | Payer: Medicare Other | Attending: Family Medicine | Admitting: Family Medicine

## 2018-06-16 ENCOUNTER — Encounter (HOSPITAL_COMMUNITY): Payer: Self-pay

## 2018-06-16 ENCOUNTER — Ambulatory Visit (INDEPENDENT_AMBULATORY_CARE_PROVIDER_SITE_OTHER): Payer: Medicare Other

## 2018-06-16 ENCOUNTER — Other Ambulatory Visit: Payer: Self-pay

## 2018-06-16 DIAGNOSIS — J40 Bronchitis, not specified as acute or chronic: Secondary | ICD-10-CM

## 2018-06-16 DIAGNOSIS — R05 Cough: Secondary | ICD-10-CM | POA: Diagnosis not present

## 2018-06-16 MED ORDER — AZITHROMYCIN 250 MG PO TABS: 250.0000 mg | ORAL_TABLET | Freq: Every day | ORAL | 0 refills | Status: DC

## 2018-06-16 MED ORDER — PREDNISONE 10 MG PO TABS: 40.0000 mg | ORAL_TABLET | Freq: Every day | ORAL | 0 refills | Status: AC

## 2018-06-16 MED ORDER — BENZONATATE 100 MG PO CAPS: 100.0000 mg | ORAL_CAPSULE | Freq: Three times a day (TID) | ORAL | 0 refills | Status: DC

## 2018-06-16 MED ORDER — GUAIFENESIN ER 600 MG PO TB12: 600.0000 mg | ORAL_TABLET | Freq: Two times a day (BID) | ORAL | 0 refills | Status: DC

## 2018-06-16 NOTE — ED Triage Notes (Signed)
Pt cc she has a deep cough, runny nose sneezing and congestion. Pt states he throat is dry and stretchy. This has been going on for a 2 weeks or more.

## 2018-06-16 NOTE — Discharge Instructions (Signed)
We are treating you for bronchitis today Prednisone daily for the next 5 days Mucinex every 12 hours Tessalon Perles for cough If you are not better with this treatment in the next 3 to 4 days go ahead and start the antibiotics Follow up as needed for continued or worsening symptoms

## 2018-06-16 NOTE — ED Provider Notes (Signed)
[facility]    CSN: [id number] Arrival date & time: 06/16/18  0807     History   Chief Complaint Chief Complaint  Patient presents with  . Cough    HPI Cindy Anderson is a 73 y.o. female.   Pt is a 73 year old female with past medical history of endometrial cancer, anemia, UTI, hyponatremia, dysphagia, thrush. She presents with cough, congestion for the past 3 weeks.  Reports that her symptoms started mid December with cough, congestion, runny nose.  After about 1 week she did have some improvement in symptoms.  Reports that she went to 3 exercise classes and noticed that her symptoms were returning.  She had some mild fatigue, body aches and chills.  She has been coughing up light-colored mucus.  She is been taking TheraFlu and daytime cold medication for symptoms.  She does not smoke.  She denies any recent traveling or sick contacts.  ROS per HPI      Past Medical History:  Diagnosis Date  . Endometrial cancer ([place])   . History of radiation therapy 12/26/15-01/29/16 and  02/13/16, 02/20/16, 02/27/16   Pelvis/ 45 Gy in 25 fractions. Vaginal cuff/ 18 Gy in 3 fractions.    Patient Active Problem List   Diagnosis Date Noted  . Endometrial cancer, FIGO stage IIIC2 ([place]) 10/09/2015  . Protein-calorie malnutrition, severe ([place]) 06/06/2015  . Symptomatic anemia 05/24/2015  . Leucocytosis 05/24/2015  . UTI (lower urinary tract infection) 05/24/2015  . Hyponatremia 05/24/2015  . Dysphagia 05/24/2015  . Thrush 05/24/2015  . Schistocytes on peripheral blood smear 05/24/2015  . Breast nodule 05/24/2015  . Abdominal mass, RLQ (right lower quadrant)   . Abnormal weight loss   . Dehydration     Past Surgical History:  Procedure Laterality Date  . TOTAL ABDOMINAL HYSTERECTOMY W/ BILATERAL SALPINGOOPHORECTOMY  06/11/15   done at [facility]    OB History    Gravida  1   Para  1   Term  1   Preterm      AB      Living  1     SAB      TAB      Ectopic      Multiple      Live Births               Home Medications    Prior to Admission medications   Medication Sig Start Date End Date Taking? Authorizing Provider  acetaminophen (TYLENOL) 325 MG tablet Take 650 mg by mouth. 06/19/15   [provider]  azithromycin (ZITHROMAX) 250 MG tablet Take 1 tablet (250 mg total) by mouth daily. Take first 2 tablets together, then 1 every day until finished. 06/16/18   Retina Bernardy, Tressia Miners A, NP  benzonatate (TESSALON) 100 MG capsule Take 1 capsule (100 mg total) by mouth every 8 (eight) hours. 06/16/18   Loura Halt A, NP  docusate sodium (COLACE) 100 MG capsule Take 100 mg by mouth.    [provider]  famotidine (PEPCID) 20 MG tablet Take 20 mg by mouth daily as needed.  06/19/15 09/23/16  [provider]  guaiFENesin (MUCINEX) 600 MG 12 hr tablet Take 1 tablet (600 mg total) by mouth 2 (two) times daily. 06/16/18   Loura Halt A, NP  ibuprofen (ADVIL,MOTRIN) 600 MG tablet Take 600 mg by mouth as needed. Reported on 10/09/2015 06/19/15   [provider]  magnesium chloride (SLOW-MAG) 64 MG TBEC SR tablet Take 71.5 mg by  mouth 2 (two) times daily. Reported on 12/17/2015    [provider]  Multiple Vitamins-Minerals (CENTRUM SILVER) tablet Take 1 tablet by mouth daily.     [provider]  predniSONE (DELTASONE) 10 MG tablet Take 4 tablets (40 mg total) by mouth daily for 5 days. 06/16/18 06/21/18  Loura Halt A, NP  pyridOXINE (VITAMIN B-6) 50 MG tablet Take 50 mg by mouth daily.    [provider]  vitamin C (ASCORBIC ACID) 500 MG tablet Take 500 mg by mouth daily.    [provider]    Family History Family History  Problem Relation Age of Onset  . Dementia Mother   . Hypertension Mother   . Hypertension Father   . CAD Father   . Breast cancer Sister   . Hypertension Other     Social History Social History   Tobacco Use  . Smoking status: Never Smoker  . Smokeless tobacco: Never Used    Substance Use Topics  . Alcohol use: No  . Drug use: No     Allergies   Patient has no known allergies.   Review of Systems Review of Systems   Physical Exam Triage Vital Signs ED Triage Vitals [06/16/18 0841]  Enc Vitals Group     BP (!) 184/91     Pulse Rate 81     Resp 16     Temp 97.7 F (36.5 C)     Temp Source Oral     SpO2 97 %     Weight      Height      Head Circumference      Peak Flow      Pain Score      Pain Loc      Pain Edu?      Excl. in [place]?    No data found.  Updated Vital Signs BP (!) 184/91 (BP Location: Left Arm)   Pulse 81   Temp 97.7 F (36.5 C) (Oral)   Resp 16   Wt 146 lb (66.2 kg)   SpO2 97%   BMI 27.59 kg/m   Visual Acuity Right Eye Distance:   Left Eye Distance:   Bilateral Distance:    Right Eye Near:   Left Eye Near:    Bilateral Near:     Physical Exam Vitals signs and nursing note reviewed.  Constitutional:      General: She is not in acute distress.    Appearance: Normal appearance. She is not ill-appearing, toxic-appearing or diaphoretic.  HENT:     Head: Normocephalic and atraumatic.     Right Ear: Tympanic membrane, ear canal and external ear normal.     Left Ear: Tympanic membrane, ear canal and external ear normal.     Nose: Congestion and rhinorrhea present.     Mouth/Throat:     Pharynx: Oropharynx is clear.  Eyes:     Conjunctiva/sclera: Conjunctivae normal.  Neck:     Musculoskeletal: Normal range of motion and neck supple.  Cardiovascular:     Rate and Rhythm: Normal rate and regular rhythm.     Pulses: Normal pulses.     Heart sounds: Normal heart sounds.  Pulmonary:     Effort: Pulmonary effort is normal.     Breath sounds: Rhonchi present.  Musculoskeletal: Normal range of motion.  Skin:    General: Skin is warm and dry.  Neurological:     Mental Status: She is alert.  Psychiatric:  Mood and Affect: Mood normal.      UC Treatments / Results  Labs (all labs ordered are  listed, but only abnormal results are displayed) Labs Reviewed - No data to display  EKG None  Radiology Dg Chest 2 View  Result Date: 06/16/2018 CLINICAL DATA:  Per pt: sick since mid December, with cough, no fever, head AND chest congestion. No history of cardiac or respiratory disease. Non smoker. No HBP. No diabetes. Chemo port for uterine cancer 2017. EXAM: CHEST - 2 VIEW COMPARISON:  None. FINDINGS: Cardiac silhouette is normal in size. No mediastinal or hilar masses. No evidence of adenopathy. Lungs are clear.  No pleural effusion or pneumothorax. Right anterior jugular Port-A-Cath tip projects in the lower superior vena cava. Skeletal structures are intact. IMPRESSION: No active cardiopulmonary disease. Electronically Signed   By: Lajean Manes M.D.   On: 06/16/2018 09:26    Procedures Procedures (including critical care time)  Medications Ordered in UC Medications - No data to display  Initial Impression / Assessment and Plan / UC Course  I have reviewed the triage vital signs and the nursing notes.  Pertinent labs & imaging results that were available during my care of the patient were reviewed by me and considered in my medical decision making (see chart for details).     Patient is a 73 year old female presents with cough and congestion x3 weeks. Lung sounds revealed rhonchi in lower bases, more on the left. We will do chest x-ray rule out pneumonia.  X ray did not reveal PNA We we will go ahead and treat for bronchitis mucinex every 12 hours Prednisone daily for the next 5 days Tessalon Perles for cough We will give prescription for azithromycin She can fill this prescription if not better with the prednisone and Mucinex Follow up as needed for continued or worsening symptoms Patient understanding and agreeable to plan Final Clinical Impressions(s) / UC Diagnoses   Final diagnoses:  Bronchitis     Discharge Instructions     We are treating you for bronchitis  today Prednisone daily for the next 5 days Mucinex every 12 hours Tessalon Perles for cough If you are not better with this treatment in the next 3 to 4 days go ahead and start the antibiotics Follow up as needed for continued or worsening symptoms     ED Prescriptions    Medication Sig Dispense Auth. Provider   guaiFENesin (MUCINEX) 600 MG 12 hr tablet Take 1 tablet (600 mg total) by mouth 2 (two) times daily. 15 tablet Mckinnley Smithey A, NP   predniSONE (DELTASONE) 10 MG tablet Take 4 tablets (40 mg total) by mouth daily for 5 days. 20 tablet Andreya Lacks A, NP   benzonatate (TESSALON) 100 MG capsule Take 1 capsule (100 mg total) by mouth every 8 (eight) hours. 21 capsule Delorese Sellin A, NP   azithromycin (ZITHROMAX) 250 MG tablet Take 1 tablet (250 mg total) by mouth daily. Take first 2 tablets together, then 1 every day until finished. 6 tablet Loura Halt A, NP     Controlled Substance Prescriptions [place] Controlled Substance Registry consulted? Not Applicable   Orvan July, NP 06/16/18 [phone]

## 2018-12-08 DIAGNOSIS — R600 Localized edema: Secondary | ICD-10-CM | POA: Diagnosis not present

## 2018-12-08 DIAGNOSIS — C541 Malignant neoplasm of endometrium: Secondary | ICD-10-CM | POA: Diagnosis not present

## 2018-12-09 DIAGNOSIS — C541 Malignant neoplasm of endometrium: Secondary | ICD-10-CM | POA: Diagnosis not present

## 2024-03-26 ENCOUNTER — Inpatient Hospital Stay (HOSPITAL_BASED_OUTPATIENT_CLINIC_OR_DEPARTMENT_OTHER)
Admission: EM | Admit: 2024-03-26 | Discharge: 2024-03-28 | DRG: 322 | Disposition: A | Discharge status: Home / Self Care | Source: Ambulatory Visit | Attending: Internal Medicine | Admitting: Internal Medicine

## 2024-03-26 ENCOUNTER — Other Ambulatory Visit: Payer: Self-pay

## 2024-03-26 ENCOUNTER — Ambulatory Visit (HOSPITAL_COMMUNITY)
Admission: EM | Admit: 2024-03-26 | Discharge: 2024-03-26 | Disposition: A | Discharge status: Home / Self Care | Attending: Internal Medicine | Admitting: Internal Medicine

## 2024-03-26 ENCOUNTER — Emergency Department (HOSPITAL_BASED_OUTPATIENT_CLINIC_OR_DEPARTMENT_OTHER)

## 2024-03-26 ENCOUNTER — Emergency Department (HOSPITAL_BASED_OUTPATIENT_CLINIC_OR_DEPARTMENT_OTHER): Admitting: Radiology

## 2024-03-26 ENCOUNTER — Encounter (HOSPITAL_COMMUNITY): Payer: Self-pay

## 2024-03-26 ENCOUNTER — Encounter (HOSPITAL_BASED_OUTPATIENT_CLINIC_OR_DEPARTMENT_OTHER): Payer: Self-pay

## 2024-03-26 DIAGNOSIS — Z9221 Personal history of antineoplastic chemotherapy: Secondary | ICD-10-CM

## 2024-03-26 DIAGNOSIS — I10 Essential (primary) hypertension: Secondary | ICD-10-CM | POA: Diagnosis present

## 2024-03-26 DIAGNOSIS — Z955 Presence of coronary angioplasty implant and graft: Secondary | ICD-10-CM

## 2024-03-26 DIAGNOSIS — E785 Hyperlipidemia, unspecified: Secondary | ICD-10-CM | POA: Diagnosis present

## 2024-03-26 DIAGNOSIS — Z8542 Personal history of malignant neoplasm of other parts of uterus: Secondary | ICD-10-CM

## 2024-03-26 DIAGNOSIS — Z923 Personal history of irradiation: Secondary | ICD-10-CM

## 2024-03-26 DIAGNOSIS — R7303 Prediabetes: Secondary | ICD-10-CM | POA: Diagnosis present

## 2024-03-26 DIAGNOSIS — Z90710 Acquired absence of both cervix and uterus: Secondary | ICD-10-CM

## 2024-03-26 DIAGNOSIS — Z803 Family history of malignant neoplasm of breast: Secondary | ICD-10-CM

## 2024-03-26 DIAGNOSIS — Z7982 Long term (current) use of aspirin: Secondary | ICD-10-CM

## 2024-03-26 DIAGNOSIS — R0789 Other chest pain: Secondary | ICD-10-CM | POA: Diagnosis not present

## 2024-03-26 DIAGNOSIS — D649 Anemia, unspecified: Secondary | ICD-10-CM | POA: Diagnosis present

## 2024-03-26 DIAGNOSIS — Z90722 Acquired absence of ovaries, bilateral: Secondary | ICD-10-CM

## 2024-03-26 DIAGNOSIS — K760 Fatty (change of) liver, not elsewhere classified: Secondary | ICD-10-CM | POA: Diagnosis present

## 2024-03-26 DIAGNOSIS — I214 Non-ST elevation (NSTEMI) myocardial infarction: Secondary | ICD-10-CM | POA: Diagnosis not present

## 2024-03-26 DIAGNOSIS — Z8249 Family history of ischemic heart disease and other diseases of the circulatory system: Secondary | ICD-10-CM

## 2024-03-26 DIAGNOSIS — I2510 Atherosclerotic heart disease of native coronary artery without angina pectoris: Secondary | ICD-10-CM | POA: Diagnosis present

## 2024-03-26 DIAGNOSIS — I700 Atherosclerosis of aorta: Secondary | ICD-10-CM | POA: Diagnosis present

## 2024-03-26 LAB — BASIC METABOLIC PANEL WITH GFR
Anion gap: 15 (ref 5–15)
BUN: 13 mg/dL (ref 8–23)
CO2: 22 mmol/L (ref 22–32)
Calcium: 10.1 mg/dL (ref 8.9–10.3)
Chloride: 102 mmol/L (ref 98–111)
Creatinine, Ser: 0.76 mg/dL (ref 0.44–1.00)
GFR, Estimated: >60 mL/min (ref >60)
Glucose, Bld: 138 mg/dL — ABNORMAL HIGH (ref 70–99)
Potassium: 3.7 mmol/L (ref 3.5–5.1)
Sodium: 139 mmol/L (ref 135–145)

## 2024-03-26 LAB — CBC
HCT: 37.6 % (ref 36.0–46.0)
Hemoglobin: 12.2 g/dL (ref 12.0–15.0)
MCH: 28.3 pg (ref 26.0–34.0)
MCHC: 32.4 g/dL (ref 30.0–36.0)
MCV: 87.2 fL (ref 80.0–100.0)
Platelets: 229 K/uL (ref 150–400)
RBC: 4.31 MIL/uL (ref 3.87–5.11)
RDW: 14.6 % (ref 11.5–15.5)
WBC: 11.4 K/uL — ABNORMAL HIGH (ref 4.0–10.5)
nRBC: 0 % (ref 0.0–0.2)

## 2024-03-26 LAB — TROPONIN T, HIGH SENSITIVITY
Troponin T High Sensitivity: 788 ng/L (ref 0–19)
Troponin T High Sensitivity: 721 ng/L (ref 0–19)

## 2024-03-26 MED ORDER — HEPARIN (PORCINE) 25000 UT/250ML-% IV SOLN
600.0000 [IU]/h | INTRAVENOUS | Status: DC
  Administered 2024-03-26: 750 [IU]/h via INTRAVENOUS
  Filled 2024-03-26: qty 250

## 2024-03-26 MED ORDER — HEPARIN BOLUS VIA INFUSION
3500.0000 [IU] | Freq: Once | INTRAVENOUS | Status: AC
  Administered 2024-03-26: 3500 [IU] via INTRAVENOUS

## 2024-03-26 MED ORDER — IOHEXOL 350 MG/ML SOLN
75.0000 mL | Freq: Once | INTRAVENOUS | Status: AC | PRN
  Administered 2024-03-26: 75 mL via INTRAVENOUS

## 2024-03-26 MED ORDER — ASPIRIN 81 MG PO CHEW
324.0000 mg | CHEWABLE_TABLET | Freq: Once | ORAL | Status: AC
  Administered 2024-03-26: 324 mg via ORAL
  Filled 2024-03-26: qty 4

## 2024-03-26 MED ORDER — HYDRALAZINE HCL 20 MG/ML IJ SOLN
10.0000 mg | Freq: Once | INTRAMUSCULAR | Status: AC
Start: 2024-03-26 — End: 2024-03-26
  Administered 2024-03-26: 10 mg via INTRAVENOUS
  Filled 2024-03-26: qty 1

## 2024-03-26 MED ORDER — HYDRALAZINE HCL 20 MG/ML IJ SOLN
10.0000 mg | Freq: Once | INTRAMUSCULAR | Status: AC
  Administered 2024-03-26: 10 mg via INTRAVENOUS
  Filled 2024-03-26: qty 1

## 2024-03-26 NOTE — Discharge Instructions (Signed)
 Due to the symptoms, EKG findings and vital signs we recommend further evaluation at the emergency room or more advanced workup can be obtained

## 2024-03-26 NOTE — Progress Notes (Signed)
 PHARMACY - ANTICOAGULATION CONSULT NOTE  Pharmacy Consult for Heparin  Indication: chest pain/ACS  No Known Allergies  Patient Measurements: Height: 5' 2.21 (158 cm) Weight: 70.3 kg (155 lb) IBW/kg (Calculated) : 50.57 HEPARIN  DW (KG): 65.3  Vital Signs: Temp: 98.4 F (36.9 C) (10/19 1822) Temp Source: Oral (10/19 1822) BP: 160/63 (10/19 1900) Pulse Rate: 93 (10/19 1900)  Labs: Recent Labs    03/26/24 1642  HGB 12.2  HCT 37.6  PLT 229  CREATININE 0.76    Estimated Creatinine Clearance: 53.5 mL/min (by C-G formula based on SCr of 0.76 mg/dL).   Medical History: Past Medical History:  Diagnosis Date   Endometrial cancer ([facility])    History of radiation therapy 12/26/15-01/29/16 and  02/13/16, 02/20/16, 02/27/16   Pelvis/ 45 Gy in 25 fractions. Vaginal cuff/ 18 Gy in 3 fractions.    Medications:  Infusions:   Assessment: 39 YOM presenting with chest pain. Not on blood thinners at home. Pharmacy consulted for heparin  infusion.  Goal of Therapy:  Heparin  level 0.3-0.7 units/ml Monitor platelets by anticoagulation protocol: Yes   Plan:  Give 3500 units bolus x 1 Start heparin  infusion at 750 units/hr Check anti-Xa level in 8 hours and daily while on heparin  Continue to monitor H&H and platelets  Aidel Davisson M Heena [place]bury 03/26/2024,7:15 PM

## 2024-03-26 NOTE — ED Provider Notes (Signed)
 [place] EMERGENCY DEPARTMENT AT [facility] Provider Note   CSN: [id number] Arrival date & time: 03/26/24  1624     Patient presents with: Chest Pain   Cindy Anderson is a 78 y.o. female.   Patient here with chest pain on and off for the last several days.  This occurred after doing exercise class on Thursday.  She is having this pain that intermittently comes in her upper back and her chest wall.  Does not appear to be exertional.  She works out 6 times a week.  She does not have pain when she does this typically or shortness of breath or chest pain.  Walking does not make it worse.  Most of the time the pain is occurring at rest.  Feels like a spasm somewhat.  She denies any fever chills cough sputum production.  No recent surgery or travel.  No cardiac history.  History of endometrial cancer.  She denies any shortness of breath.  She has been cancer free for several years.  The history is provided by the patient.       Prior to Admission medications   Medication Sig Start Date End Date Taking? Authorizing Provider  acetaminophen  (TYLENOL ) 325 MG tablet Take 650 mg by mouth. 06/19/15   [provider]  azithromycin  (ZITHROMAX ) 250 MG tablet Take 1 tablet (250 mg total) by mouth daily. Take first 2 tablets together, then 1 every day until finished. 06/16/18   Adah Corning A, FNP  benzonatate  (TESSALON ) 100 MG capsule Take 1 capsule (100 mg total) by mouth every 8 (eight) hours. 06/16/18   Adah Corning A, FNP  docusate sodium  (COLACE) 100 MG capsule Take 100 mg by mouth.    [provider]  famotidine (PEPCID) 20 MG tablet Take 20 mg by mouth daily as needed.  06/19/15 09/23/16  [provider]  guaiFENesin  (MUCINEX ) 600 MG 12 hr tablet Take 1 tablet (600 mg total) by mouth 2 (two) times daily. 06/16/18   Adah Corning A, FNP  ibuprofen (ADVIL,MOTRIN) 600 MG tablet Take 600 mg by mouth as needed. Reported on 10/09/2015 06/19/15   [provider]  magnesium  chloride (SLOW-MAG) 64 MG TBEC SR tablet Take 71.5 mg by mouth 2 (two) times daily. Reported on 12/17/2015    [provider]  Multiple Vitamins-Minerals (CENTRUM SILVER) tablet Take 1 tablet by mouth daily.     [provider]  pyridOXINE (VITAMIN B-6) 50 MG tablet Take 50 mg by mouth daily.    [provider]  vitamin C (ASCORBIC ACID) 500 MG tablet Take 500 mg by mouth daily.    [provider]    Allergies: Patient has no known allergies.    Review of Systems  Updated Vital Signs BP (!) 174/96 (BP Location: Right Arm)   Pulse 91   Temp 98.4 F (36.9 C) (Oral)   Resp 18   Ht 5' 2.21 (1.58 m)   Wt 70.3 kg   SpO2 95%   BMI 28.16 kg/m   Physical Exam Vitals and nursing note reviewed.  Constitutional:      General: She is not in acute distress.    Appearance: She is well-developed. She is not ill-appearing.  HENT:     Head: Normocephalic and atraumatic.  Eyes:     Extraocular Movements: Extraocular movements intact.     Conjunctiva/sclera: Conjunctivae normal.     Pupils: Pupils are equal, round, and reactive to light.  Cardiovascular:     Rate and  Rhythm: Normal rate and regular rhythm.     Pulses:          Radial pulses are 2+ on the right side and 2+ on the left side.     Heart sounds: Normal heart sounds. No murmur heard. Pulmonary:     Effort: Pulmonary effort is normal. No respiratory distress.     Breath sounds: Normal breath sounds. No decreased breath sounds or wheezing.  Abdominal:     Palpations: Abdomen is soft.     Tenderness: There is no abdominal tenderness.  Musculoskeletal:        General: No swelling.     Cervical back: Normal range of motion and neck supple.  Skin:    General: Skin is warm and dry.     Capillary Refill: Capillary refill takes less than 2 seconds.  Neurological:     General: No focal deficit present.     Mental Status: She is alert.  Psychiatric:        Mood and Affect: Mood normal.      (all labs ordered are listed, but only abnormal results are displayed) Labs Reviewed  BASIC METABOLIC PANEL WITH GFR - Abnormal; Notable for the following components:      Result Value   Glucose, Bld 138 (*)    All other components within normal limits  CBC - Abnormal; Notable for the following components:   WBC 11.4 (*)    All other components within normal limits  TROPONIN T, HIGH SENSITIVITY - Abnormal; Notable for the following components:   Troponin T High Sensitivity 788 (*)    All other components within normal limits  TROPONIN T, HIGH SENSITIVITY    EKG: EKG Interpretation Date/Time:  Sunday March 26 2024 16:32:34 EDT Ventricular Rate:  94 PR Interval:  192 QRS Duration:  100 QT Interval:  338 QTC Calculation: 422 R Axis:   25  Text Interpretation: Normal sinus rhythm Low voltage QRS When compared with ECG of 26-Mar-2024 15:39, Acute Anterior infarct is now Present Confirmed by Ruthe Cornet [id number]) on 03/26/2024 4:36:11 PM  Radiology: CT Angio Chest PE W and/or Wo Contrast Result Date: 03/26/2024 CLINICAL DATA:  Concern for pulmonary embolism. EXAM: CT ANGIOGRAPHY CHEST WITH CONTRAST TECHNIQUE: Multidetector CT imaging of the chest was performed using the standard protocol during bolus administration of intravenous contrast. Multiplanar CT image reconstructions and MIPs were obtained to evaluate the vascular anatomy. RADIATION DOSE REDUCTION: This exam was performed according to the departmental dose-optimization program which includes automated exposure control, adjustment of the mA and/or kV according to patient size and/or use of iterative reconstruction technique. CONTRAST:  75mL OMNIPAQUE  IOHEXOL  350 MG/ML SOLN COMPARISON:  Chest CT dated 05/24/2015. Radiograph dated 03/26/2024. FINDINGS: Cardiovascular: There is no cardiomegaly or pericardial effusion. There is coronary vascular calcification. Moderate calcified and noncalcified plaque of the thoracic aorta. No  aneurysmal dilatation or dissection. A 9 mm intraluminal low-attenuation in the descending thoracic aorta (75/4), likely a noncalcified plaque. No pulmonary artery embolus identified. Mediastinum/Nodes: No hilar or mediastinal adenopathy. The esophagus is grossly unremarkable no mediastinal fluid collection. Right-sided Port-A-Cath with tip at the cavoatrial junction. Lungs/Pleura: No focal consolidation, pleural effusion, or pneumothorax. The central airways are patent. Upper Abdomen: Fatty liver. Musculoskeletal: No acute osseous pathology. Review of the MIP images confirms the above findings. IMPRESSION: 1. No acute intrathoracic pathology. No CT evidence of pulmonary artery embolus. 2. Fatty liver. 3.  Aortic Atherosclerosis (ICD10-I70.0). Electronically Signed   By: Vanetta Shelia HERO.D.  On: 03/26/2024 18:36   DG Chest Port 1 View Result Date: 03/26/2024 CLINICAL DATA:  Intermittent chest pain since Thursday, hypertension, abnormal EKG EXAM: PORTABLE CHEST 1 VIEW COMPARISON:  06/16/2018 FINDINGS: Single frontal view of the chest demonstrates stable right chest wall port. Cardiac silhouette is unremarkable. No airspace disease, effusion, or pneumothorax. The fibrillator pad overlies the upper chest. No acute bony abnormalities. IMPRESSION: 1. No acute intrathoracic process. Electronically Signed   By: Ozell Daring M.D.   On: 03/26/2024 17:31     .Critical Care  Performed by: Ruthe Cornet, DO Authorized by: Ruthe Cornet, DO   Critical care provider statement:    Critical care time (minutes):  35   Critical care was necessary to treat or prevent imminent or life-threatening deterioration of the following conditions:  Cardiac failure   Critical care was time spent personally by me on the following activities:  Blood draw for specimens, discussions with consultants, development of treatment plan with patient or surrogate, discussions with primary provider, evaluation of patient's response to  treatment, examination of patient, obtaining history from patient or surrogate, ordering and performing treatments and interventions, ordering and review of laboratory studies, ordering and review of radiographic studies, pulse oximetry, re-evaluation of patient's condition and review of old charts   Care discussed with: admitting provider      Medications Ordered in the ED  hydrALAZINE (APRESOLINE) injection 10 mg (10 mg Intravenous Given 03/26/24 1837)  aspirin chewable tablet 324 mg (324 mg Oral Given 03/26/24 1837)  iohexol  (OMNIPAQUE ) 350 MG/ML injection 75 mL (75 mLs Intravenous Contrast Given 03/26/24 1759)                HEART Score: 2                    Medical Decision Making Amount and/or Complexity of Data Reviewed Labs: ordered. Radiology: ordered.  Risk OTC drugs. Prescription drug management. Decision regarding hospitalization.   Masako Overall is here for chest pain.  Intermittent for the last several days started after vigorous Jazzercise class.  She is not having any exertional pain.  She has a history of remote cancer of endometrial cancer.  Overall pain is reproducible on exam.  Is not short of breath.  Pain comes and goes at random times.  She denies any weakness numbness tingling.  No headache.  EKG shows sinus rhythm.  She has got somewhat of bundle branch and inferior leads.  I Dr. Waddell will cardiology review EKG and overall no signs of ischemic changes.  I do not think this is a STEMI. will get troponin and CT scan of the chest to evaluate for PE given her cancer history and will check basic labs.  She has elevated blood pressure but otherwise unremarkable vitals.  She is pain-free now at this time.  She does not have any major cardiac risk factors other than her age at this point.  Differential includes ACS MSK PE.  She is comfortable at this time and have no concern for dissection.  Overall troponin is elevated to 788.  She is pain-free.  No significant  leukocytosis anemia or electrolyte abnormality otherwise.  PE scan negative for PE or other acute process.  I called cardiology again at this time talked with Dr. Otelia.  Patient to be started on heparin  bolus and infusion given aspirin given hydralazine for blood pressure.  She remains pain-free.  Will admit her to hospitalist service for further NSTEMI care.  She will get  catheterization likely in the a.m.  This chart was dictated using voice recognition software.  Despite best efforts to proofread,  errors can occur which can change the documentation meaning.       Final diagnoses:  NSTEMI (non-ST elevated myocardial infarction) [facility])    ED Discharge Orders     None          Ruthe Cornet, DO 03/26/24 1849

## 2024-03-26 NOTE — ED Provider Notes (Signed)
 Briefly, 78 year old female presents urgent care with complaints of heaviness in the chest and pressure.  The symptoms started mostly today.  She did noticed on Thursday she was having some discomfort in her shoulders but related this to doing exercises.  She exercises on a weekly basis.  This is not associated with shortness of breath, severe pain, fevers, cough, wheezing.  She has not had any history of cardiac disease.  She denies any previous history of having these symptoms.  On physical exam she is alert and oriented.  She has a regular rate.  EKG does show some nonspecific changes.  She is hypertensive.  Given the presentation and findings here in clinic we recommend further evaluation at the emergency room for more advanced workup can be obtained.  Patient understands and will go to the emergency room now.   Teresa Almarie LABOR, PA-C 03/26/24 1554

## 2024-03-26 NOTE — ED Triage Notes (Signed)
 Patient seen at urgent care for intermittent chest pain since Thursday. She said it could happen with or without activity. She also noticed some pain in her shoulders. She says she is very active and exercises and thought it was from working out as she had done a back and chest jazzersize work out.   Urgent care said there were some changes on her EKG and she was hypertensive and said to come here.

## 2024-03-26 NOTE — ED Notes (Signed)
 Lab called a troponin of 721-Dr. Curatolo aware.

## 2024-03-26 NOTE — ED Triage Notes (Signed)
 Patient reports on Thursday she noticed shoulder discomfort. Today she began to have intermittent chest tightness with heaviness on chest. She denies SHOB, no vision changes.  The patient reports she exercises weekly.

## 2024-03-26 NOTE — ED Notes (Signed)
 Patient is being discharged from the Urgent Care and sent to the Emergency Department via POV with spouse . Per E> White PA C, patient is in need of higher level of care due to need for further evaluation . Patient is aware and verbalizes understanding of plan of care.  Vitals:   03/26/24 1532 03/26/24 1545  BP: (!) 174/72 (!) 174/71  Pulse: 92   Resp: 16   Temp: 98.2 F (36.8 C)   SpO2: 98%

## 2024-03-27 ENCOUNTER — Encounter (HOSPITAL_COMMUNITY)
Admission: EM | Disposition: A | Discharge status: Home / Self Care | Payer: Self-pay | Source: Ambulatory Visit | Attending: Internal Medicine

## 2024-03-27 ENCOUNTER — Inpatient Hospital Stay (HOSPITAL_COMMUNITY)

## 2024-03-27 ENCOUNTER — Encounter (HOSPITAL_COMMUNITY): Payer: Self-pay | Admitting: Internal Medicine

## 2024-03-27 DIAGNOSIS — I700 Atherosclerosis of aorta: Secondary | ICD-10-CM | POA: Diagnosis present

## 2024-03-27 DIAGNOSIS — Z8249 Family history of ischemic heart disease and other diseases of the circulatory system: Secondary | ICD-10-CM | POA: Diagnosis not present

## 2024-03-27 DIAGNOSIS — Z923 Personal history of irradiation: Secondary | ICD-10-CM | POA: Diagnosis not present

## 2024-03-27 DIAGNOSIS — Z7982 Long term (current) use of aspirin: Secondary | ICD-10-CM | POA: Diagnosis not present

## 2024-03-27 DIAGNOSIS — K760 Fatty (change of) liver, not elsewhere classified: Secondary | ICD-10-CM | POA: Diagnosis present

## 2024-03-27 DIAGNOSIS — I214 Non-ST elevation (NSTEMI) myocardial infarction: Secondary | ICD-10-CM

## 2024-03-27 DIAGNOSIS — Z8542 Personal history of malignant neoplasm of other parts of uterus: Secondary | ICD-10-CM | POA: Diagnosis not present

## 2024-03-27 DIAGNOSIS — R7303 Prediabetes: Secondary | ICD-10-CM | POA: Diagnosis present

## 2024-03-27 DIAGNOSIS — E785 Hyperlipidemia, unspecified: Secondary | ICD-10-CM | POA: Diagnosis present

## 2024-03-27 DIAGNOSIS — I2510 Atherosclerotic heart disease of native coronary artery without angina pectoris: Secondary | ICD-10-CM | POA: Diagnosis present

## 2024-03-27 DIAGNOSIS — Z90710 Acquired absence of both cervix and uterus: Secondary | ICD-10-CM | POA: Diagnosis not present

## 2024-03-27 DIAGNOSIS — I10 Essential (primary) hypertension: Secondary | ICD-10-CM | POA: Diagnosis present

## 2024-03-27 DIAGNOSIS — Z90722 Acquired absence of ovaries, bilateral: Secondary | ICD-10-CM | POA: Diagnosis not present

## 2024-03-27 DIAGNOSIS — D649 Anemia, unspecified: Secondary | ICD-10-CM | POA: Diagnosis present

## 2024-03-27 DIAGNOSIS — Z9221 Personal history of antineoplastic chemotherapy: Secondary | ICD-10-CM | POA: Diagnosis not present

## 2024-03-27 DIAGNOSIS — Z803 Family history of malignant neoplasm of breast: Secondary | ICD-10-CM | POA: Diagnosis not present

## 2024-03-27 HISTORY — PX: CORONARY STENT INTERVENTION: CATH118234

## 2024-03-27 HISTORY — PX: LEFT HEART CATH AND CORONARY ANGIOGRAPHY: CATH118249

## 2024-03-27 LAB — POCT I-STAT 7, (LYTES, BLD GAS, ICA,H+H)
Acid-base deficit: 2 mmol/L (ref 0.0–2.0)
Bicarbonate: 22.5 mmol/L (ref 20.0–28.0)
Calcium, Ion: 1.24 mmol/L (ref 1.15–1.40)
HCT: 31 % — ABNORMAL LOW (ref 36.0–46.0)
Hemoglobin: 10.5 g/dL — ABNORMAL LOW (ref 12.0–15.0)
O2 Saturation: 93 %
Potassium: 3.5 mmol/L (ref 3.5–5.1)
Sodium: 135 mmol/L (ref 135–145)
TCO2: 24 mmol/L (ref 22–32)
pCO2 arterial: 37.9 mmHg (ref 32–48)
pH, Arterial: 7.382 (ref 7.35–7.45)
pO2, Arterial: 68 mmHg — ABNORMAL LOW (ref 83–108)

## 2024-03-27 LAB — HEPARIN LEVEL (UNFRACTIONATED)
Heparin Unfractionated: 0.85 [IU]/mL — ABNORMAL HIGH (ref 0.30–0.70)
Heparin Unfractionated: >1.1 [IU]/mL — ABNORMAL HIGH (ref 0.30–0.70)

## 2024-03-27 LAB — CBC
HCT: 37.2 % (ref 36.0–46.0)
Hemoglobin: 12.2 g/dL (ref 12.0–15.0)
MCH: 28.2 pg (ref 26.0–34.0)
MCHC: 32.8 g/dL (ref 30.0–36.0)
MCV: 85.9 fL (ref 80.0–100.0)
Platelets: 240 K/uL (ref 150–400)
RBC: 4.33 MIL/uL (ref 3.87–5.11)
RDW: 14.6 % (ref 11.5–15.5)
WBC: 11.2 K/uL — ABNORMAL HIGH (ref 4.0–10.5)
nRBC: 0 % (ref 0.0–0.2)

## 2024-03-27 LAB — LIPID PANEL
Cholesterol: 200 mg/dL (ref 0–200)
HDL: 49 mg/dL (ref >40)
LDL Cholesterol: 125 mg/dL — ABNORMAL HIGH (ref 0–99)
Total CHOL/HDL Ratio: 4.1 ratio
Triglycerides: 129 mg/dL (ref <150)
VLDL: 26 mg/dL (ref 0–40)

## 2024-03-27 LAB — HEMOGLOBIN A1C
Hgb A1c MFr Bld: 6.2 % — ABNORMAL HIGH (ref 4.8–5.6)
Mean Plasma Glucose: 131.24 mg/dL

## 2024-03-27 LAB — POCT ACTIVATED CLOTTING TIME
Activated Clotting Time: 250 s
Activated Clotting Time: 262 s
Activated Clotting Time: 279 s

## 2024-03-27 MED ORDER — CARVEDILOL 6.25 MG PO TABS
6.2500 mg | ORAL_TABLET | Freq: Two times a day (BID) | ORAL | Status: DC
  Administered 2024-03-27 (×2): 6.25 mg via ORAL
  Filled 2024-03-27 (×3): qty 1

## 2024-03-27 MED ORDER — HEPARIN SODIUM (PORCINE) 1000 UNIT/ML IJ SOLN
INTRAMUSCULAR | Status: AC
Start: 2024-03-27 — End: 2024-03-27
  Filled 2024-03-27: qty 10

## 2024-03-27 MED ORDER — FENTANYL CITRATE (PF) 100 MCG/2ML IJ SOLN
INTRAMUSCULAR | Status: DC | PRN
  Administered 2024-03-27: 25 ug via INTRAVENOUS

## 2024-03-27 MED ORDER — HEPARIN (PORCINE) IN NACL 1000-0.9 UT/500ML-% IV SOLN
INTRAVENOUS | Status: DC | PRN
  Administered 2024-03-27: 500 mL
  Administered 2024-03-27: 1000 mL

## 2024-03-27 MED ORDER — VERAPAMIL HCL 2.5 MG/ML IV SOLN
INTRAVENOUS | Status: DC | PRN
  Administered 2024-03-27: 10 mL via INTRA_ARTERIAL

## 2024-03-27 MED ORDER — ASPIRIN 81 MG PO TBEC
81.0000 mg | DELAYED_RELEASE_TABLET | Freq: Every day | ORAL | Status: DC
  Administered 2024-03-28: 81 mg via ORAL
  Filled 2024-03-27: qty 1

## 2024-03-27 MED ORDER — SODIUM CHLORIDE 0.9% FLUSH
3.0000 mL | Freq: Two times a day (BID) | INTRAVENOUS | Status: DC
  Administered 2024-03-27 – 2024-03-28 (×2): 3 mL via INTRAVENOUS

## 2024-03-27 MED ORDER — IOHEXOL 350 MG/ML SOLN
INTRAVENOUS | Status: DC | PRN
  Administered 2024-03-27: 168 mL

## 2024-03-27 MED ORDER — ASPIRIN 81 MG PO CHEW: 81.0000 mg | CHEWABLE_TABLET | ORAL | Status: DC

## 2024-03-27 MED ORDER — MIDAZOLAM HCL 2 MG/2ML IJ SOLN
INTRAMUSCULAR | Status: AC
  Filled 2024-03-27: qty 2

## 2024-03-27 MED ORDER — ALBUTEROL SULFATE (2.5 MG/3ML) 0.083% IN NEBU: 2.5000 mg | INHALATION_SOLUTION | RESPIRATORY_TRACT | Status: DC | PRN

## 2024-03-27 MED ORDER — SODIUM CHLORIDE 0.9% FLUSH: 3.0000 mL | Freq: Two times a day (BID) | INTRAVENOUS | Status: DC

## 2024-03-27 MED ORDER — HEPARIN SODIUM (PORCINE) 1000 UNIT/ML IJ SOLN
INTRAMUSCULAR | Status: AC
  Filled 2024-03-27: qty 10

## 2024-03-27 MED ORDER — FREE WATER
500.0000 mL | Freq: Once | Status: AC
  Administered 2024-03-27: 500 mL via ORAL

## 2024-03-27 MED ORDER — TICAGRELOR 90 MG PO TABS
ORAL_TABLET | ORAL | Status: DC | PRN
  Administered 2024-03-27: 180 mg via ORAL

## 2024-03-27 MED ORDER — HYDRALAZINE HCL 20 MG/ML IJ SOLN
10.0000 mg | INTRAMUSCULAR | Status: AC | PRN
  Administered 2024-03-27: 10 mg via INTRAVENOUS
  Filled 2024-03-27: qty 1

## 2024-03-27 MED ORDER — ENOXAPARIN SODIUM 40 MG/0.4ML IJ SOSY
40.0000 mg | PREFILLED_SYRINGE | INTRAMUSCULAR | Status: DC
  Administered 2024-03-28: 40 mg via SUBCUTANEOUS
  Filled 2024-03-27: qty 0.4

## 2024-03-27 MED ORDER — SODIUM CHLORIDE 0.9 % IV SOLN: 250.0000 mL | INTRAVENOUS | Status: AC | PRN

## 2024-03-27 MED ORDER — ACETAMINOPHEN 325 MG PO TABS: 650.0000 mg | ORAL_TABLET | ORAL | Status: DC | PRN

## 2024-03-27 MED ORDER — ONDANSETRON HCL 4 MG/2ML IJ SOLN: 4.0000 mg | Freq: Four times a day (QID) | INTRAMUSCULAR | Status: DC | PRN

## 2024-03-27 MED ORDER — LIDOCAINE HCL (PF) 1 % IJ SOLN
INTRAMUSCULAR | Status: DC | PRN
  Administered 2024-03-27: 2 mL via INTRADERMAL

## 2024-03-27 MED ORDER — MIDAZOLAM HCL (PF) 2 MG/2ML IJ SOLN
INTRAMUSCULAR | Status: DC | PRN
  Administered 2024-03-27: 1 mg via INTRAVENOUS

## 2024-03-27 MED ORDER — LIDOCAINE HCL (PF) 1 % IJ SOLN
INTRAMUSCULAR | Status: AC
  Filled 2024-03-27: qty 30

## 2024-03-27 MED ORDER — VERAPAMIL HCL 2.5 MG/ML IV SOLN
INTRAVENOUS | Status: AC
  Filled 2024-03-27: qty 2

## 2024-03-27 MED ORDER — TICAGRELOR 90 MG PO TABS
90.0000 mg | ORAL_TABLET | Freq: Two times a day (BID) | ORAL | Status: DC
  Administered 2024-03-28 (×2): 90 mg via ORAL
  Filled 2024-03-27 (×2): qty 1

## 2024-03-27 MED ORDER — ATORVASTATIN CALCIUM 40 MG PO TABS
40.0000 mg | ORAL_TABLET | Freq: Every day | ORAL | Status: DC
  Administered 2024-03-28: 40 mg via ORAL
  Filled 2024-03-27: qty 1

## 2024-03-27 MED ORDER — LABETALOL HCL 5 MG/ML IV SOLN: 10.0000 mg | INTRAVENOUS | Status: AC | PRN

## 2024-03-27 MED ORDER — FENTANYL CITRATE (PF) 100 MCG/2ML IJ SOLN
INTRAMUSCULAR | Status: AC
  Filled 2024-03-27: qty 2

## 2024-03-27 MED ORDER — FREE WATER: 500.0000 mL | Freq: Once | Status: DC

## 2024-03-27 MED ORDER — TICAGRELOR 90 MG PO TABS
ORAL_TABLET | ORAL | Status: AC
  Filled 2024-03-27: qty 2

## 2024-03-27 MED ORDER — HEPARIN SODIUM (PORCINE) 1000 UNIT/ML IJ SOLN
INTRAMUSCULAR | Status: DC | PRN
  Administered 2024-03-27: 2000 [IU] via INTRAVENOUS
  Administered 2024-03-27 (×2): 3500 [IU] via INTRAVENOUS
  Administered 2024-03-27: 2000 [IU] via INTRAVENOUS

## 2024-03-27 MED ORDER — SODIUM CHLORIDE 0.9% FLUSH: 3.0000 mL | INTRAVENOUS | Status: DC | PRN

## 2024-03-27 MED ORDER — NITROGLYCERIN 1 MG/10 ML FOR IR/CATH LAB
INTRA_ARTERIAL | Status: AC
  Filled 2024-03-27: qty 10

## 2024-03-27 MED ORDER — NITROGLYCERIN 1 MG/10 ML FOR IR/CATH LAB
INTRA_ARTERIAL | Status: DC | PRN
  Administered 2024-03-27: 200 ug via INTRACORONARY

## 2024-03-27 NOTE — Progress Notes (Signed)
 Arrived to [facility] from [facility] by [facility]. Heparin  infusing at 600U/hour. Denies discomfort, denies CP.

## 2024-03-27 NOTE — Consult Note (Signed)
 Cardiology Consultation   Patient ID: Cindy Anderson MRN: [id number]; DOB: 1945/08/08  Admit date: 03/26/2024 Date of Consult: 03/27/2024  PCP:  Freddrick, No   [facility] Providers Cardiologist:  None      Patient Profile: Cindy Anderson is a 78 y.o. female with a hx of endometrial cancer (in remission since 2020) s/p hysterectomy who is being seen 03/27/2024 for the evaluation of NSTEMI at the request of Dr. Shona.  History of Present Illness: Cindy Anderson is a 78 year old female with above medical history.  Per chart review, patient does not appear to have any past cardiac history and does not follow with a cardiologist.  Patient presented to the ED at [facility] on 10/19.  Reported that on Thursday 10/16 she started to have shoulder discomfort.  On 10/19, she began to have intermittent chest tightness.  Described as heaviness.  She had initially gone to urgent care for evaluation and was told that her blood pressure was very elevated so she was sent to the ED for further evaluation.  In the ED, initial BP 190/92.  Oxygen 92% on room air. EKG showed NSR, RSR' in leads II, III.  Labs significant for high-sensitivity troponin 788>721.  CTA chest showed no acute intrathoracic pathology.  There was coronary vascular calcifications noted.  BMP grossly within normal limits.  CBC grossly within normal limits aside from WBC 11.4.  Patient was started on IV heparin .  While in the ED, her chest pain resolved.  She was admitted to the internal medicine service and cardiology consulted for further treatment of NSTEMI   Past Medical History:  Diagnosis Date   Endometrial cancer [facility])    History of radiation therapy 12/26/15-01/29/16 and  02/13/16, 02/20/16, 02/27/16   Pelvis/ 45 Gy in 25 fractions. Vaginal cuff/ 18 Gy in 3 fractions.    Past Surgical History:  Procedure Laterality Date   TOTAL ABDOMINAL HYSTERECTOMY W/ BILATERAL SALPINGOOPHORECTOMY  06/11/15   done at [facility]      Scheduled  Meds:  aspirin EC  81 mg Oral Daily   atorvastatin  40 mg Oral Daily   carvedilol  6.25 mg Oral BID WC   Continuous Infusions:  heparin  600 Units/hr (03/27/24 0345)   PRN Meds:   Allergies:   No Known Allergies  Social History:   Social History   Socioeconomic History   Marital status: Married    Spouse name: Not on file   Number of children: 1   Years of education: Not on file   Highest education level: Not on file  Occupational History   Occupation: retired Runner, broadcasting/film/video  Tobacco Use   Smoking status: Never   Smokeless tobacco: Never  Substance and Sexual Activity   Alcohol use: No   Drug use: No   Sexual activity: Not on file  Other Topics Concern   Not on file  Social History Narrative   Not on file   Social Drivers of Health   Financial Resource Strain: Not on file  Food Insecurity: Not on file  Transportation Needs: Not on file  Physical Activity: Not on file  Stress: Not on file  Social Connections: Not on file  Intimate Partner Violence: Not on file    Family History:    Family History  Problem Relation Age of Onset   Dementia Mother    Hypertension Mother    Hypertension Father    CAD Father    Breast cancer Sister    Hypertension Other      ROS:  Please see the history of present illness.   All other ROS reviewed and negative.     Physical Exam/Data: Vitals:   03/27/24 0515 03/27/24 0530 03/27/24 0600 03/27/24 0630  BP: 128/77 (!) 145/52 (!) 147/57 (!) 152/51  Pulse: 85 82 77 87  Resp: (!) 23 (!) 21 18 (!) 21  Temp:      TempSrc:      SpO2: 96% 96% 95% 95%  Weight:      Height:       No intake or output data in the 24 hours ending 03/27/24 0743    03/26/2024    6:31 PM 06/16/2018    8:52 AM 09/23/2016    1:23 PM  Last 3 Weights  Weight (lbs) 155 lb 146 lb 138 lb 6.4 oz  Weight (kg) 70.308 kg 66.225 kg 62.778 kg     Body mass index is 28.16 kg/m.    Physical Exam per MD   Laboratory Data: High Sensitivity Troponin:  No  results for input(s): TROPONINIHS in the last 720 hours.   Chemistry Recent Labs  Lab 03/26/24 1642  NA 139  K 3.7  CL 102  CO2 22  GLUCOSE 138*  BUN 13  CREATININE 0.76  CALCIUM 10.1  GFRNONAA >60  ANIONGAP 15    No results for input(s): PROT, ALBUMIN, AST, ALT, ALKPHOS, BILITOT in the last 168 hours. Lipids No results for input(s): CHOL, TRIG, HDL, LABVLDL, LDLCALC, CHOLHDL in the last 168 hours.  Hematology Recent Labs  Lab 03/26/24 1642 03/27/24 0247  WBC 11.4* 11.2*  RBC 4.31 4.33  HGB 12.2 12.2  HCT 37.6 37.2  MCV 87.2 85.9  MCH 28.3 28.2  MCHC 32.4 32.8  RDW 14.6 14.6  PLT 229 240   Thyroid No results for input(s): TSH, FREET4 in the last 168 hours.  BNPNo results for input(s): BNP, PROBNP in the last 168 hours.  DDimer No results for input(s): DDIMER in the last 168 hours.  Radiology/Studies:  CT Angio Chest PE W and/or Wo Contrast Result Date: 03/26/2024 CLINICAL DATA:  Concern for pulmonary embolism. EXAM: CT ANGIOGRAPHY CHEST WITH CONTRAST TECHNIQUE: Multidetector CT imaging of the chest was performed using the standard protocol during bolus administration of intravenous contrast. Multiplanar CT image reconstructions and MIPs were obtained to evaluate the vascular anatomy. RADIATION DOSE REDUCTION: This exam was performed according to the departmental dose-optimization program which includes automated exposure control, adjustment of the mA and/or kV according to patient size and/or use of iterative reconstruction technique. CONTRAST:  75mL OMNIPAQUE  IOHEXOL  350 MG/ML SOLN COMPARISON:  Chest CT dated 05/24/2015. Radiograph dated 03/26/2024. FINDINGS: Cardiovascular: There is no cardiomegaly or pericardial effusion. There is coronary vascular calcification. Moderate calcified and noncalcified plaque of the thoracic aorta. No aneurysmal dilatation or dissection. A 9 mm intraluminal low-attenuation in the descending thoracic aorta  (75/4), likely a noncalcified plaque. No pulmonary artery embolus identified. Mediastinum/Nodes: No hilar or mediastinal adenopathy. The esophagus is grossly unremarkable no mediastinal fluid collection. Right-sided Port-A-Cath with tip at the cavoatrial junction. Lungs/Pleura: No focal consolidation, pleural effusion, or pneumothorax. The central airways are patent. Upper Abdomen: Fatty liver. Musculoskeletal: No acute osseous pathology. Review of the MIP images confirms the above findings. IMPRESSION: 1. No acute intrathoracic pathology. No CT evidence of pulmonary artery embolus. 2. Fatty liver. 3.  Aortic Atherosclerosis (ICD10-I70.0). Electronically Signed   By: Vanetta Chou M.D.   On: 03/26/2024 18:36   DG Chest Port 1 View Result Date: 03/26/2024 CLINICAL DATA:  Intermittent  chest pain since Thursday, hypertension, abnormal EKG EXAM: PORTABLE CHEST 1 VIEW COMPARISON:  06/16/2018 FINDINGS: Single frontal view of the chest demonstrates stable right chest wall port. Cardiac silhouette is unremarkable. No airspace disease, effusion, or pneumothorax. The fibrillator pad overlies the upper chest. No acute bony abnormalities. IMPRESSION: 1. No acute intrathoracic process. Electronically Signed   By: Ozell Daring M.D.   On: 03/26/2024 17:31     Assessment and Plan:  NSTEMI - Patient presented to the [facility] ED with chest pain.  Found to be hypertensive with BP as high as 190/92.  High-sensitivity troponin 788>721.  EKG with some nonspecific changes - CTA chest showed no evidence of PE.  Did note coronary calcifications - Continue IV heparin  - Start carvedilol 6.25 mg twice daily - Start aspirin 81 mg daily, start Lipitor 40 mg daily - Ordered fasting lipid panel, A1c - Plan for cardiac catheterization - Ordered echocardiogram  HTN  - BP elevated 190/92 on arrival to the ED, currently 152/51 - Start carvedilol 6.25 mg twice daily as above - Add further BP medications as needed     Risk Assessment/Risk Scores:  TIMI Risk Score for Unstable Angina or Non-ST Elevation MI:   The patient's TIMI risk score is 2, which indicates a 8% risk of all cause mortality, new or recurrent myocardial infarction or need for urgent revascularization in the next 14 days.{   For questions or updates, please contact [facility] Please consult [email or web address] for contact info under   Signed, Rollo FABIENE Louder, PA-C  03/27/2024 7:43 AM

## 2024-03-27 NOTE — ED Notes (Signed)
 Rate change completed and verified by Tia RN at bedside.

## 2024-03-27 NOTE — Progress Notes (Signed)
 PHARMACY - ANTICOAGULATION CONSULT NOTE  Pharmacy Consult for Heparin  Indication: chest pain/ACS  No Known Allergies  Patient Measurements: Height: 5' 2.21 (158 cm) Weight: 70.3 kg (155 lb) IBW/kg (Calculated) : 50.57 HEPARIN  DW (KG): 65.3  Vital Signs: Temp: 98.3 F (36.8 C) (10/19 2330) Temp Source: Oral (10/19 2330) BP: 146/57 (10/20 0230) Pulse Rate: 76 (10/20 0230)  Labs: Recent Labs    03/26/24 1642 03/27/24 0247  HGB 12.2 12.2  HCT 37.6 37.2  PLT 229 240  HEPARINUNFRC  --  0.85*  CREATININE 0.76  --     Estimated Creatinine Clearance: 53.5 mL/min (by C-G formula based on SCr of 0.76 mg/dL).   Medical History: Past Medical History:  Diagnosis Date   Endometrial cancer (HCC)    History of radiation therapy 12/26/15-01/29/16 and  02/13/16, 02/20/16, 02/27/16   Pelvis/ 45 Gy in 25 fractions. Vaginal cuff/ 18 Gy in 3 fractions.    Medications:  Infusions:   heparin  750 Units/hr (03/26/24 1946)    Assessment: 77 y/o F presenting with chest pain. Not on blood thinners at home. Pharmacy consulted for heparin  infusion.  10/20 AM update:  Heparin  level supra-therapeutic  Goal of Therapy:  Heparin  level 0.3-0.7 units/ml Monitor platelets by anticoagulation protocol: Yes   Plan:  Dec heparin  to 600 units/hr Heparin  level in 8 hours  Lynwood Mckusick, PharmD, BCPS Clinical Pharmacist Phone: [phone]

## 2024-03-27 NOTE — ED Notes (Signed)
 Thomas from [facility] called. Transport is on the [facility]

## 2024-03-27 NOTE — ED Notes (Signed)
[facility] here to transport pt 

## 2024-03-27 NOTE — Progress Notes (Signed)
 Echocardiogram not performed today due to patient coming from [facility] directly to cath lab. Echocardiogram will be scheduled for 03/28/24.

## 2024-03-27 NOTE — H&P (View-Only) (Signed)
 Cardiology Consultation   Patient ID: [name] MRN: [id number]; DOB: 1945/08/08  Admit date: 03/26/2024 Date of Consult: 03/27/2024  PCP:  Freddrick, No   [facility] Providers Cardiologist:  None      Patient Profile: Cindy Anderson is a 78 y.o. female with a hx of endometrial cancer (in remission since 2020) s/p hysterectomy who is being seen 03/27/2024 for the evaluation of NSTEMI at the request of Dr. Shona.  History of Present Illness: Cindy Anderson is a 78 year old female with above medical history.  Per chart review, patient does not appear to have any past cardiac history and does not follow with a cardiologist.  Patient presented to the ED at [facility] on 10/19.  Reported that on Thursday 10/16 she started to have shoulder discomfort.  On 10/19, she began to have intermittent chest tightness.  Described as heaviness.  She had initially gone to urgent care for evaluation and was told that her blood pressure was very elevated so she was sent to the ED for further evaluation.  In the ED, initial BP 190/92.  Oxygen 92% on room air. EKG showed NSR, RSR' in leads II, III.  Labs significant for high-sensitivity troponin 788>721.  CTA chest showed no acute intrathoracic pathology.  There was coronary vascular calcifications noted.  BMP grossly within normal limits.  CBC grossly within normal limits aside from WBC 11.4.  Patient was started on IV heparin .  While in the ED, her chest pain resolved.  She was admitted to the internal medicine service and cardiology consulted for further treatment of NSTEMI   Past Medical History:  Diagnosis Date   Endometrial cancer [facility])    History of radiation therapy 12/26/15-01/29/16 and  02/13/16, 02/20/16, 02/27/16   Pelvis/ 45 Gy in 25 fractions. Vaginal cuff/ 18 Gy in 3 fractions.    Past Surgical History:  Procedure Laterality Date   TOTAL ABDOMINAL HYSTERECTOMY W/ BILATERAL SALPINGOOPHORECTOMY  06/11/15   done at [facility]      Scheduled  Meds:  aspirin EC  81 mg Oral Daily   atorvastatin  40 mg Oral Daily   carvedilol  6.25 mg Oral BID WC   Continuous Infusions:  heparin  600 Units/hr (03/27/24 0345)   PRN Meds:   Allergies:   No Known Allergies  Social History:   Social History   Socioeconomic History   Marital status: Married    Spouse name: Not on file   Number of children: 1   Years of education: Not on file   Highest education level: Not on file  Occupational History   Occupation: retired Runner, broadcasting/film/video  Tobacco Use   Smoking status: Never   Smokeless tobacco: Never  Substance and Sexual Activity   Alcohol use: No   Drug use: No   Sexual activity: Not on file  Other Topics Concern   Not on file  Social History Narrative   Not on file   Social Drivers of Health   Financial Resource Strain: Not on file  Food Insecurity: Not on file  Transportation Needs: Not on file  Physical Activity: Not on file  Stress: Not on file  Social Connections: Not on file  Intimate Partner Violence: Not on file    Family History:    Family History  Problem Relation Age of Onset   Dementia Mother    Hypertension Mother    Hypertension Father    CAD Father    Breast cancer Sister    Hypertension Other      ROS:  Please see the history of present illness.   All other ROS reviewed and negative.     Physical Exam/Data: Vitals:   03/27/24 0515 03/27/24 0530 03/27/24 0600 03/27/24 0630  BP: 128/77 (!) 145/52 (!) 147/57 (!) 152/51  Pulse: 85 82 77 87  Resp: (!) 23 (!) 21 18 (!) 21  Temp:      TempSrc:      SpO2: 96% 96% 95% 95%  Weight:      Height:       No intake or output data in the 24 hours ending 03/27/24 0743    03/26/2024    6:31 PM 06/16/2018    8:52 AM 09/23/2016    1:23 PM  Last 3 Weights  Weight (lbs) 155 lb 146 lb 138 lb 6.4 oz  Weight (kg) 70.308 kg 66.225 kg 62.778 kg     Body mass index is 28.16 kg/m.    Physical Exam per MD   Laboratory Data: High Sensitivity Troponin:  No  results for input(s): TROPONINIHS in the last 720 hours.   Chemistry Recent Labs  Lab 03/26/24 1642  NA 139  K 3.7  CL 102  CO2 22  GLUCOSE 138*  BUN 13  CREATININE 0.76  CALCIUM 10.1  GFRNONAA >60  ANIONGAP 15    No results for input(s): PROT, ALBUMIN, AST, ALT, ALKPHOS, BILITOT in the last 168 hours. Lipids No results for input(s): CHOL, TRIG, HDL, LABVLDL, LDLCALC, CHOLHDL in the last 168 hours.  Hematology Recent Labs  Lab 03/26/24 1642 03/27/24 0247  WBC 11.4* 11.2*  RBC 4.31 4.33  HGB 12.2 12.2  HCT 37.6 37.2  MCV 87.2 85.9  MCH 28.3 28.2  MCHC 32.4 32.8  RDW 14.6 14.6  PLT 229 240   Thyroid No results for input(s): TSH, FREET4 in the last 168 hours.  BNPNo results for input(s): BNP, PROBNP in the last 168 hours.  DDimer No results for input(s): DDIMER in the last 168 hours.  Radiology/Studies:  CT Angio Chest PE W and/or Wo Contrast Result Date: 03/26/2024 CLINICAL DATA:  Concern for pulmonary embolism. EXAM: CT ANGIOGRAPHY CHEST WITH CONTRAST TECHNIQUE: Multidetector CT imaging of the chest was performed using the standard protocol during bolus administration of intravenous contrast. Multiplanar CT image reconstructions and MIPs were obtained to evaluate the vascular anatomy. RADIATION DOSE REDUCTION: This exam was performed according to the departmental dose-optimization program which includes automated exposure control, adjustment of the mA and/or kV according to patient size and/or use of iterative reconstruction technique. CONTRAST:  75mL OMNIPAQUE  IOHEXOL  350 MG/ML SOLN COMPARISON:  Chest CT dated 05/24/2015. Radiograph dated 03/26/2024. FINDINGS: Cardiovascular: There is no cardiomegaly or pericardial effusion. There is coronary vascular calcification. Moderate calcified and noncalcified plaque of the thoracic aorta. No aneurysmal dilatation or dissection. A 9 mm intraluminal low-attenuation in the descending thoracic aorta  (75/4), likely a noncalcified plaque. No pulmonary artery embolus identified. Mediastinum/Nodes: No hilar or mediastinal adenopathy. The esophagus is grossly unremarkable no mediastinal fluid collection. Right-sided Port-A-Cath with tip at the cavoatrial junction. Lungs/Pleura: No focal consolidation, pleural effusion, or pneumothorax. The central airways are patent. Upper Abdomen: Fatty liver. Musculoskeletal: No acute osseous pathology. Review of the MIP images confirms the above findings. IMPRESSION: 1. No acute intrathoracic pathology. No CT evidence of pulmonary artery embolus. 2. Fatty liver. 3.  Aortic Atherosclerosis (ICD10-I70.0). Electronically Signed   By: Vanetta Chou M.D.   On: 03/26/2024 18:36   DG Chest Port 1 View Result Date: 03/26/2024 CLINICAL DATA:  Intermittent  chest pain since Thursday, hypertension, abnormal EKG EXAM: PORTABLE CHEST 1 VIEW COMPARISON:  06/16/2018 FINDINGS: Single frontal view of the chest demonstrates stable right chest wall port. Cardiac silhouette is unremarkable. No airspace disease, effusion, or pneumothorax. The fibrillator pad overlies the upper chest. No acute bony abnormalities. IMPRESSION: 1. No acute intrathoracic process. Electronically Signed   By: Ozell Daring M.D.   On: 03/26/2024 17:31     Assessment and Plan:  NSTEMI - Patient presented to the [facility] ED with chest pain.  Found to be hypertensive with BP as high as 190/92.  High-sensitivity troponin 788>721.  EKG with some nonspecific changes - CTA chest showed no evidence of PE.  Did note coronary calcifications - Continue IV heparin  - Start carvedilol 6.25 mg twice daily - Start aspirin 81 mg daily, start Lipitor 40 mg daily - Ordered fasting lipid panel, A1c - Plan for cardiac catheterization - Ordered echocardiogram  HTN  - BP elevated 190/92 on arrival to the ED, currently 152/51 - Start carvedilol 6.25 mg twice daily as above - Add further BP medications as needed     Risk Assessment/Risk Scores:  TIMI Risk Score for Unstable Angina or Non-ST Elevation MI:   The patient's TIMI risk score is 2, which indicates a 8% risk of all cause mortality, new or recurrent myocardial infarction or need for urgent revascularization in the next 14 days.{   For questions or updates, please contact [facility] Please consult [email or web address] for contact info under   Signed, Rollo FABIENE Louder, PA-C  03/27/2024 7:43 AM

## 2024-03-27 NOTE — H&P (Addendum)
 History and Physical    Cindy Anderson FMW:[id number] DOB: [birth year] DOA: 03/26/2024  PCP: Pcp, No   I have briefly reviewed patients previous medical reports in [facility].  Patient coming from: Home  Chief Complaint: Chest and back pain  HPI: Cindy Anderson is a 78 year old married female, physically quite active, does Jazz exercise daily and teaches ice-skating, history of endometrial cancer, s/p TAH, BSO 06/2015, followed by chemoradiation, reported to be in remission, presented to ED at [facility] on 10/19 with complaints of chest and back pain.  3 days PTA, she noted pain across her shoulder blades followed by upper mid anterior chest pain, rated at 3/10 in severity, nonradiating, which she felt was related to her Jazz exercise.  She did not make much out of it.  She was able to sleep that night.  Next day, chest pain had resolved, she still had soreness of her shoulders, continued to do her exercises.  On day of ED visit, she was driving back from the grocery store where she had essentially been sitting in the car while her spouse was out shopping, when she noticed recurrence of chest pain, worse than prior, 6/10 in severity, localized, associated with some diaphoresis but no nausea, vomiting, dizziness, lightheadedness or palpitations.  They then decided to come to the ED  ED Course: In ED hypertensive, 174/72, intermittently tachypneic in the low 20s but not hypoxic.  Lab work significant for troponin T high-sensitivity 788 > 721.  Glucose 138, hemoglobin 11.4.  Chest x-ray showed no acute intrathoracic process. CTA chest PE protocol:  No acute intrathoracic pathology. No CT evidence of pulmonary artery embolus.  It showed fatty liver and aortic atherosclerosis.  Patient was given a dose of aspirin 324 mg, received as needed doses of IV hydralazine for high blood pressure, started on IV heparin  drip.  Cardiology was consulted.  From ED, patient was directly transported to [facility]  Cath Lab. She underwent left heart cath, coronary angiography and successful PCI to distal LCx with DES.  Cath results showed severe single-vessel CAD with 95% stenosis of distal LCx consistent with acute plaque rupture.  There was also moderate, approaching severe disease involving the LAD and proximal/mid circumflex  After patient had to return to the floor, [facility] were called to admit this patient.  Patient currently without chest pain.  Still has soreness in both shoulders and neck.  Review of Systems:  All other systems reviewed and apart from HPI, are negative.  Past Medical History:  Diagnosis Date   Endometrial cancer [facility])    History of radiation therapy 12/26/15-01/29/16 and  02/13/16, 02/20/16, 02/27/16   Pelvis/ 45 Gy in 25 fractions. Vaginal cuff/ 18 Gy in 3 fractions.    Past Surgical History:  Procedure Laterality Date   TOTAL ABDOMINAL HYSTERECTOMY W/ BILATERAL SALPINGOOPHORECTOMY  06/11/15   done at [facility]    Social History  reports that she has never smoked. She has never used smokeless tobacco. She reports that she does not drink alcohol and does not use drugs.  No Known Allergies  Family History  Problem Relation Age of Onset   Dementia Mother    Hypertension Mother    Hypertension Father    CAD Father    Breast cancer Sister    Hypertension Other      Prior to Admission medications   Medication Sig Start Date End Date Taking? Authorizing Provider  acetaminophen  (TYLENOL ) 325 MG tablet Take 650 mg by mouth. 06/19/15   [provider]  azithromycin  (ZITHROMAX ) 250 MG tablet Take 1 tablet (250 mg total) by mouth daily. Take first 2 tablets together, then 1 every day until finished. 06/16/18   Adah Corning A, FNP  benzonatate  (TESSALON ) 100 MG capsule Take 1 capsule (100 mg total) by mouth every 8 (eight) hours. 06/16/18   Adah Corning A, FNP  docusate sodium  (COLACE) 100 MG capsule Take 100 mg by mouth.    [provider]  famotidine (PEPCID) 20 MG  tablet Take 20 mg by mouth daily as needed.  06/19/15 09/23/16  [provider]  guaiFENesin  (MUCINEX ) 600 MG 12 hr tablet Take 1 tablet (600 mg total) by mouth 2 (two) times daily. 06/16/18   Adah Corning A, FNP  ibuprofen (ADVIL,MOTRIN) 600 MG tablet Take 600 mg by mouth as needed. Reported on 10/09/2015 06/19/15   [provider]  magnesium chloride (SLOW-MAG) 64 MG TBEC SR tablet Take 71.5 mg by mouth 2 (two) times daily. Reported on 12/17/2015    [provider]  Multiple Vitamins-Minerals (CENTRUM SILVER) tablet Take 1 tablet by mouth daily.     [provider]  pyridOXINE (VITAMIN B-6) 50 MG tablet Take 50 mg by mouth daily.    [provider]  vitamin C (ASCORBIC ACID) 500 MG tablet Take 500 mg by mouth daily.    [provider]    Physical Exam: Vitals:   03/27/24 1500 03/27/24 1542 03/27/24 1615 03/27/24 1644  BP: (!) 153/64 (!) 160/60 (!) 135/52 (!) 146/64  Pulse: 81 80 89 95  Resp: (!) 22  16 20   Temp:    98 F (36.7 C)  TempSrc:    Oral  SpO2: 97% 97% 98% 98%  Weight:      Height:          Constitutional: Elderly female, moderately built and nourished lying comfortably propped up in bed without distress. Eyes: PERTLA, lids and conjunctivae normal.  Bilateral immature cataracts. ENMT: Mucous membranes are moist. Posterior pharynx clear of any exudate or lesions. Normal dentition.  Neck: supple, no masses, no thyromegaly Respiratory: Clear to auscultation without wheezing, rhonchi or crackles. No increased work of breathing. Cardiovascular: S1 & S2 heard, regular rate and rhythm. No JVD, murmurs, rubs or clicks. No pedal edema.  Telemetry personally reviewed at bedside: Sinus rhythm. Abdomen: Non distended. Non tender. Soft. No organomegaly or masses appreciated. No clinical Ascites. Normal bowel sounds heard. Musculoskeletal: no clubbing / cyanosis. No joint deformity upper and lower extremities. Good ROM, no contractures.  Normal muscle tone.  Skin: no rashes, lesions, ulcers. No induration Neurologic: CN 2-12 grossly intact. Sensation intact, DTR normal. Strength 5/5 in all 4 limbs.  She has Band on right wrist at site of cath. Psychiatric: Normal judgment and insight. Alert and oriented x 3. Normal mood.     Labs on Admission: I have personally reviewed following labs and imaging studies  CBC: Recent Labs  Lab 03/26/24 1642 03/27/24 0247 03/27/24 1330  WBC 11.4* 11.2*  --   HGB 12.2 12.2 10.5*  HCT 37.6 37.2 31.0*  MCV 87.2 85.9  --   PLT 229 240  --     Basic Metabolic Panel: Recent Labs  Lab 03/26/24 1642 03/27/24 1330  NA 139 135  K 3.7 3.5  CL 102  --   CO2 22  --   GLUCOSE 138*  --   BUN 13  --   CREATININE 0.76  --   CALCIUM 10.1  --  Liver Function Tests: No results for input(s): AST, ALT, ALKPHOS, BILITOT, PROT, ALBUMIN in the last 168 hours.   Radiological Exams on Admission: CARDIAC CATHETERIZATION Result Date: 03/27/2024 Conclusions: Severe single-vessel coronary artery disease with 95% stenosis of distal LCx (dominant vessel) consistent with acute plaque rupture.  There is also moderate, approaching severe disease involving the LAD and proximal/mid LCx. Preserved left ventricular systolic function with subtle basal inferior hypokinesis (LVEF 55-65%).  Mildly elevated left ventricular filling pressure (LVEDP 19 mmHg). Successful PCI to distal LCx using Synergy XD 2.25 x 12 mm drug-eluting stent with 0% residual stenosis and TIMI-3 flow. Recommendations: Dual antiplatelet therapy with aspirin and ticagrelor for at least 12 months. Aggressive secondary prevention of coronary artery disease. Favor medical therapy for residual LAD and LCx disease. Follow-up echocardiogram. [name], MD [facility]  CT Angio Chest PE W and/or Wo Contrast Result Date: 03/26/2024 CLINICAL DATA:  Concern for pulmonary embolism. EXAM: CT ANGIOGRAPHY CHEST WITH CONTRAST  TECHNIQUE: Multidetector CT imaging of the chest was performed using the standard protocol during bolus administration of intravenous contrast. Multiplanar CT image reconstructions and MIPs were obtained to evaluate the vascular anatomy. RADIATION DOSE REDUCTION: This exam was performed according to the departmental dose-optimization program which includes automated exposure control, adjustment of the mA and/or kV according to patient size and/or use of iterative reconstruction technique. CONTRAST:  75mL OMNIPAQUE  IOHEXOL  350 MG/ML SOLN COMPARISON:  Chest CT dated 05/24/2015. Radiograph dated 03/26/2024. FINDINGS: Cardiovascular: There is no cardiomegaly or pericardial effusion. There is coronary vascular calcification. Moderate calcified and noncalcified plaque of the thoracic aorta. No aneurysmal dilatation or dissection. A 9 mm intraluminal low-attenuation in the descending thoracic aorta (75/4), likely a noncalcified plaque. No pulmonary artery embolus identified. Mediastinum/Nodes: No hilar or mediastinal adenopathy. The esophagus is grossly unremarkable no mediastinal fluid collection. Right-sided Port-A-Cath with tip at the cavoatrial junction. Lungs/Pleura: No focal consolidation, pleural effusion, or pneumothorax. The central airways are patent. Upper Abdomen: Fatty liver. Musculoskeletal: No acute osseous pathology. Review of the MIP images confirms the above findings. IMPRESSION: 1. No acute intrathoracic pathology. No CT evidence of pulmonary artery embolus. 2. Fatty liver. 3.  Aortic Atherosclerosis (ICD10-I70.0). Electronically Signed   By: Vanetta Chou M.D.   On: 03/26/2024 18:36   DG Chest Port 1 View Result Date: 03/26/2024 CLINICAL DATA:  Intermittent chest pain since Thursday, hypertension, abnormal EKG EXAM: PORTABLE CHEST 1 VIEW COMPARISON:  06/16/2018 FINDINGS: Single frontal view of the chest demonstrates stable right chest wall port. Cardiac silhouette is unremarkable. No airspace  disease, effusion, or pneumothorax. The fibrillator pad overlies the upper chest. No acute bony abnormalities. IMPRESSION: 1. No acute intrathoracic process. Electronically Signed   By: Ozell Daring M.D.   On: 03/26/2024 17:31    EKG: Independently reviewed.  10/19: Sinus rhythm at 89 bpm, normal axis, T wave inversion in 1 and aVL.?  Partial BBB morphology.  Assessment/Plan Principal Problem:   NSTEMI (non-ST elevated myocardial infarction) (HCC)     NSTEMI CAD High-sensitivity troponin T 788 > 721. EKG with nonspecific ST-T wave changes. CTA chest negative for PE. IV heparin  was initiated.  Aspirin 324 mg x 1 dose Cardiology consulted.  10/20: S/p  left heart cath, coronary angiography and successful PCI to distal LCx with DES.  Cath results showed severe single-vessel CAD with 95% stenosis of distal LCx consistent with acute plaque rupture.  There was also moderate, approaching severe disease involving the LAD and proximal/mid circumflex Post cath, patient on aspirin 81  mg daily, Brilinta 90 mg twice daily, atorvastatin 40 mg daily, carvedilol 6.25 mg twice daily Chest pain has resolved. 2D echo to be done.  Follow lipid panel results/pending.  A1c 6.2.  Prediabetes A1c 6.2. Outpatient follow-up.  ?  Anemia Hemoglobin dropped from 12.2 precath to 10.5 g Intracath.  No reported bleeding Follow CBC in AM.  History of endometrial cancer s/p TAH, BSO 06/2015, followed by chemoradiation Reported to be in remission,   DVT prophylaxis: Lovenox Code Status: Full, confirmed with patient Family Communication: None at bedside Disposition Plan:   Patient is from:  Home  Anticipated DC to:  Home  Anticipated DC date:  Possibly 10/21 pending cardiology clearance  Anticipated DC barriers: None   Consults called: Cardiology Admission status: Inpatient  Severity of Illness: The appropriate patient status for this patient is INPATIENT. Inpatient status is judged to be reasonable  and necessary in order to provide the required intensity of service to ensure the patient's safety. The patient's presenting symptoms, physical exam findings, and initial radiographic and laboratory data in the context of their chronic comorbidities is felt to place them at high risk for further clinical deterioration. Furthermore, it is not anticipated that the patient will be medically stable for discharge from the hospital within 2 midnights of admission.   * I certify that at the point of admission it is my clinical judgment that the patient will require inpatient hospital care spanning beyond 2 midnights from the point of admission due to high intensity of service, high risk for further deterioration and high frequency of surveillance required.*    Trenda Mar MD FACP, [facility], [facility], [facility]   [facility] Advisor [place]    To contact the attending provider between 7A-7P or the covering provider during after hours 7P-7A, please log into the web site [email or web address] and access using universal [place] password for that web site. If you do not have the password, please call the hospital operator.  03/27/2024, 5:12 PM

## 2024-03-27 NOTE — Interval H&P Note (Signed)
 History and Physical Interval Note:  03/27/2024 12:38 PM  Cindy Anderson  has presented today for surgery, with the diagnosis of NSTEMI.  The various methods of treatment have been discussed with the patient and family. After consideration of risks, benefits and other options for treatment, the patient has consented to  Procedure(s): LEFT HEART CATH AND CORONARY ANGIOGRAPHY (N/A) as a surgical intervention.  The patient's history has been reviewed, patient examined, no change in status, stable for surgery.  I have reviewed the patient's chart and labs.  Questions were answered to the patient's satisfaction.    Cath Lab Visit (complete for each Cath Lab visit)  Clinical Evaluation Leading to the Procedure:   ACS: Yes.    Non-ACS:  N/A  Jameil Whitmoyer

## 2024-03-28 ENCOUNTER — Other Ambulatory Visit (HOSPITAL_COMMUNITY): Payer: Self-pay

## 2024-03-28 ENCOUNTER — Other Ambulatory Visit: Payer: Self-pay

## 2024-03-28 ENCOUNTER — Inpatient Hospital Stay (HOSPITAL_COMMUNITY)

## 2024-03-28 DIAGNOSIS — I10 Essential (primary) hypertension: Secondary | ICD-10-CM | POA: Diagnosis not present

## 2024-03-28 DIAGNOSIS — I214 Non-ST elevation (NSTEMI) myocardial infarction: Secondary | ICD-10-CM

## 2024-03-28 LAB — BASIC METABOLIC PANEL WITH GFR
Anion gap: 10 (ref 5–15)
BUN: 11 mg/dL (ref 8–23)
CO2: 22 mmol/L (ref 22–32)
Calcium: 9 mg/dL (ref 8.9–10.3)
Chloride: 107 mmol/L (ref 98–111)
Creatinine, Ser: 0.79 mg/dL (ref 0.44–1.00)
GFR, Estimated: >60 mL/min (ref >60)
Glucose, Bld: 125 mg/dL — ABNORMAL HIGH (ref 70–99)
Potassium: 3.5 mmol/L (ref 3.5–5.1)
Sodium: 139 mmol/L (ref 135–145)

## 2024-03-28 LAB — ECHOCARDIOGRAM COMPLETE
AR max vel: 1.29 cm2
AV Area VTI: 1.6 cm2
AV Area mean vel: 1 cm2
AV Mean grad: 5 mmHg
AV Peak grad: 8.5 mmHg
Ao pk vel: 1.46 m/s
Area-P 1/2: 6.12 cm2
Calc EF: 59.9 %
Height: 62.205 in
MV M vel: 4.7 m/s
MV Peak grad: 88.2 mmHg
MV VTI: 1.98 cm2
S' Lateral: 2.5 cm
Single Plane A2C EF: 52.8 %
Single Plane A4C EF: 64.6 %
Weight: 2480 [oz_av]

## 2024-03-28 LAB — CBC
HCT: 34.1 % — ABNORMAL LOW (ref 36.0–46.0)
Hemoglobin: 11.1 g/dL — ABNORMAL LOW (ref 12.0–15.0)
MCH: 28.4 pg (ref 26.0–34.0)
MCHC: 32.6 g/dL (ref 30.0–36.0)
MCV: 87.2 fL (ref 80.0–100.0)
Platelets: 244 K/uL (ref 150–400)
RBC: 3.91 MIL/uL (ref 3.87–5.11)
RDW: 14.6 % (ref 11.5–15.5)
WBC: 9.5 K/uL (ref 4.0–10.5)
nRBC: 0 % (ref 0.0–0.2)

## 2024-03-28 LAB — HEPATIC FUNCTION PANEL
ALT: 23 U/L (ref 0–44)
AST: 33 U/L (ref 15–41)
Albumin: 2.9 g/dL — ABNORMAL LOW (ref 3.5–5.0)
Alkaline Phosphatase: 49 U/L (ref 38–126)
Bilirubin, Direct: 0.2 mg/dL (ref 0.0–0.2)
Indirect Bilirubin: 0.9 mg/dL (ref 0.3–0.9)
Total Bilirubin: 1.1 mg/dL (ref 0.0–1.2)
Total Protein: 5.9 g/dL — ABNORMAL LOW (ref 6.5–8.1)

## 2024-03-28 MED ORDER — CARVEDILOL 12.5 MG PO TABS
12.5000 mg | ORAL_TABLET | Freq: Two times a day (BID) | ORAL | Status: DC
  Administered 2024-03-28: 12.5 mg via ORAL
  Filled 2024-03-28: qty 1

## 2024-03-28 MED ORDER — ATORVASTATIN CALCIUM 40 MG PO TABS
40.0000 mg | ORAL_TABLET | Freq: Every day | ORAL | 2 refills | Status: DC
  Filled 2024-03-28: qty 30, 30d supply, fill #0

## 2024-03-28 MED ORDER — TICAGRELOR 90 MG PO TABS
90.0000 mg | ORAL_TABLET | Freq: Two times a day (BID) | ORAL | 2 refills | Status: DC
  Filled 2024-03-28: qty 60, 30d supply, fill #0

## 2024-03-28 MED ORDER — DOCUSATE SODIUM 100 MG PO CAPS: 100.0000 mg | ORAL_CAPSULE | Freq: Every day | ORAL | Status: AC | PRN

## 2024-03-28 MED ORDER — CARVEDILOL 6.25 MG PO TABS
6.2500 mg | ORAL_TABLET | Freq: Two times a day (BID) | ORAL | 2 refills | Status: DC
  Filled 2024-03-28: qty 60, 30d supply, fill #0

## 2024-03-28 MED ORDER — ACETAMINOPHEN 325 MG PO TABS: 650.0000 mg | ORAL_TABLET | Freq: Four times a day (QID) | ORAL | Status: AC | PRN

## 2024-03-28 MED ORDER — CARVEDILOL 12.5 MG PO TABS
12.5000 mg | ORAL_TABLET | Freq: Two times a day (BID) | ORAL | 2 refills | Status: DC
  Filled 2024-03-28: qty 60, 30d supply, fill #0

## 2024-03-28 MED ORDER — ASPIRIN 81 MG PO TBEC
81.0000 mg | DELAYED_RELEASE_TABLET | Freq: Every day | ORAL | 2 refills | Status: AC
  Filled 2024-03-28: qty 30, 30d supply, fill #0

## 2024-03-28 NOTE — TOC CM/SW Note (Signed)
 Transition of Care [facility]) - Inpatient Brief Assessment   Patient Details  Name: Cindy Anderson MRN: [id number] Date of Birth: [birth year]  Transition of Care [facility]) CM/SW Contact:    Sudie Erminio Deems, RN Phone Number: 03/28/2024, 10:50 AM   Clinical Narrative: Patient presented for chest pain. PTA patient was independent from home with spouse. Patient states she does not have a PCP; however, she would like to do some research before securing PCP-MD is aware. Patient states she lives and works between [place] and [place]. No home needs identified at this time.     Transition of Care Asessment: Insurance and Status: Insurance coverage has been reviewed Patient has primary care physician: No (Patient states she is in between [place] and [place] and she wants to research a PCP) Home environment has been reviewed: reviewed Prior level of function:: independent Prior/Current Home Services: No current home services Social Drivers of Health Review: SDOH reviewed no interventions necessary Readmission risk has been reviewed: Yes Transition of care needs: no transition of care needs at this time

## 2024-03-28 NOTE — Discharge Instructions (Signed)

## 2024-03-28 NOTE — Plan of Care (Signed)
 Problem: Education: Goal: Knowledge of General Education information will improve Description: Including pain rating scale, medication(s)/side effects and non-pharmacologic comfort measures 03/28/2024 1444 by Marylu Randine NOVAK, RN Outcome: Adequate for Discharge 03/28/2024 1320 by Marylu Randine NOVAK, RN Outcome: Progressing   Problem: Health Behavior/Discharge Planning: Goal: Ability to manage health-related needs will improve 03/28/2024 1444 by Marylu Randine NOVAK, RN Outcome: Adequate for Discharge 03/28/2024 1320 by Marylu Randine NOVAK, RN Outcome: Progressing   Problem: Clinical Measurements: Goal: Ability to maintain clinical measurements within normal limits will improve 03/28/2024 1444 by Marylu Randine NOVAK, RN Outcome: Adequate for Discharge 03/28/2024 1320 by Marylu Randine NOVAK, RN Outcome: Progressing Goal: Will remain free from infection 03/28/2024 1444 by Marylu Randine NOVAK, RN Outcome: Adequate for Discharge 03/28/2024 1320 by Marylu Randine NOVAK, RN Outcome: Progressing Goal: Diagnostic test results will improve 03/28/2024 1444 by Marylu Randine NOVAK, RN Outcome: Adequate for Discharge 03/28/2024 1320 by Marylu Randine NOVAK, RN Outcome: Progressing Goal: Respiratory complications will improve 03/28/2024 1444 by Marylu Randine NOVAK, RN Outcome: Adequate for Discharge 03/28/2024 1320 by Marylu Randine NOVAK, RN Outcome: Progressing Goal: Cardiovascular complication will be avoided 03/28/2024 1444 by Marylu Randine NOVAK, RN Outcome: Adequate for Discharge 03/28/2024 1320 by Marylu Randine NOVAK, RN Outcome: Progressing   Problem: Activity: Goal: Risk for activity intolerance will decrease 03/28/2024 1444 by Marylu Randine NOVAK, RN Outcome: Adequate for Discharge 03/28/2024 1320 by Marylu Randine NOVAK, RN Outcome: Progressing   Problem: Nutrition: Goal: Adequate nutrition will be maintained 03/28/2024 1444 by Marylu Randine NOVAK, RN Outcome: Adequate for Discharge 03/28/2024 1320 by Marylu Randine NOVAK, RN Outcome:  Progressing   Problem: Coping: Goal: Level of anxiety will decrease 03/28/2024 1444 by Marylu Randine NOVAK, RN Outcome: Adequate for Discharge 03/28/2024 1320 by Marylu Randine NOVAK, RN Outcome: Progressing   Problem: Elimination: Goal: Will not experience complications related to bowel motility 03/28/2024 1444 by Marylu Randine NOVAK, RN Outcome: Adequate for Discharge 03/28/2024 1320 by Marylu Randine NOVAK, RN Outcome: Progressing Goal: Will not experience complications related to urinary retention 03/28/2024 1444 by Marylu Randine NOVAK, RN Outcome: Adequate for Discharge 03/28/2024 1320 by Marylu Randine NOVAK, RN Outcome: Progressing   Problem: Pain Managment: Goal: General experience of comfort will improve and/or be controlled 03/28/2024 1444 by Marylu Randine NOVAK, RN Outcome: Adequate for Discharge 03/28/2024 1320 by Marylu Randine NOVAK, RN Outcome: Progressing   Problem: Safety: Goal: Ability to remain free from injury will improve 03/28/2024 1444 by Marylu Randine NOVAK, RN Outcome: Adequate for Discharge 03/28/2024 1320 by Marylu Randine NOVAK, RN Outcome: Progressing   Problem: Skin Integrity: Goal: Risk for impaired skin integrity will decrease 03/28/2024 1444 by Marylu Randine NOVAK, RN Outcome: Adequate for Discharge 03/28/2024 1320 by Marylu Randine NOVAK, RN Outcome: Progressing   Problem: Education: Goal: Understanding of CV disease, CV risk reduction, and recovery process will improve 03/28/2024 1444 by Marylu Randine NOVAK, RN Outcome: Adequate for Discharge 03/28/2024 1320 by Marylu Randine NOVAK, RN Outcome: Progressing Goal: Individualized Educational Video(s) 03/28/2024 1444 by Marylu Randine NOVAK, RN Outcome: Adequate for Discharge 03/28/2024 1320 by Marylu Randine NOVAK, RN Outcome: Progressing   Problem: Activity: Goal: Ability to return to baseline activity level will improve 03/28/2024 1444 by Marylu Randine NOVAK, RN Outcome: Adequate for Discharge 03/28/2024 1320 by Marylu Randine NOVAK, RN Outcome:  Progressing   Problem: Cardiovascular: Goal: Ability to achieve and maintain adequate cardiovascular perfusion will improve 03/28/2024 1444 by Marylu Randine NOVAK, RN Outcome: Adequate for Discharge 03/28/2024 1320 by Marylu Randine NOVAK, RN Outcome: Progressing  Goal: Vascular access site(s) Level 0-1 will be maintained 03/28/2024 1444 by Marylu Randine NOVAK, RN Outcome: Adequate for Discharge 03/28/2024 1320 by Marylu Randine NOVAK, RN Outcome: Progressing   Problem: Health Behavior/Discharge Planning: Goal: Ability to safely manage health-related needs after discharge will improve 03/28/2024 1444 by Marylu Randine NOVAK, RN Outcome: Adequate for Discharge 03/28/2024 1320 by Marylu Randine NOVAK, RN Outcome: Progressing

## 2024-03-28 NOTE — Plan of Care (Signed)

## 2024-03-28 NOTE — Discharge Summary (Addendum)
 Physician Discharge Summary  Reverie Vaquera FMW:[id number] DOB: [birth year]  PCP: Pcp, No  Admitted from: Home Discharged to: Home  Admit date: 03/26/2024 Discharge date: 03/28/2024  Recommendations for Outpatient Follow-up:    Follow-up Information     Family Doctor. Schedule an appointment as soon as possible for a visit.   Why: Patient plans to find her own Family Physician.  Advised her to try and get 1 to be seen in the next 2 weeks with repeat labs (CBC & CMP).        End, Lonni, MD Follow up.   Specialty: Cardiology Why: MDs office will arrange follow-up.  Please call them if you do not hear from them in the next 2-3 business days. Contact information: [address] [place] [address] [phone]                  Home Health: None    Equipment/Devices: None    Discharge Condition: Improved and stable.   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  3 days PTA, she noted pain across her shoulder blades followed by upper mid anterior chest pain, rated at 3/10 in severity, nonradiating, which she felt was related to her Jazz exercise.  She did not make much out of it.  She was able to sleep that night.  Next day, chest pain had resolved, she still had soreness of her shoulders, continued to do her exercises.  On day of ED visit, she was driving back  from the grocery store where she had essentially been sitting in the car while her spouse was out shopping, when she noticed recurrence of chest pain, worse than prior, 6/10 in severity, localized, associated with some diaphoresis but no nausea, vomiting, dizziness, lightheadedness or palpitations.  They then decided to come to the ED   ED Course: In ED hypertensive, 174/72, intermittently tachypneic in the low 20s but not hypoxic.   Lab work significant for troponin T high-sensitivity 788 > 721.  Glucose 138, hemoglobin 11.4.   Chest x-ray showed no acute intrathoracic process. CTA chest PE protocol:  No acute intrathoracic pathology. No CT evidence of pulmonary artery embolus.  It showed fatty liver and aortic atherosclerosis.   Patient was given a dose of aspirin 324 mg, received as needed doses of IV hydralazine for high blood pressure, started on IV heparin  drip.  Cardiology was consulted.  From ED, patient was directly transported to [facility] Cath Lab. She underwent left heart cath, coronary angiography and successful PCI to distal LCx with DES.  Cath results showed severe single-vessel CAD with 95% stenosis of distal LCx consistent with acute plaque rupture.  There was also moderate, approaching severe disease involving the LAD and proximal/mid circumflex  Post Cath, TRH were asked to admit the patient.  In summary, she was admitted with  chest pain, elevated troponin, diagnosed with NSTEMI, underwent cardiac cath with stent placement.  Assessment/Plan    NSTEMI CAD High-sensitivity troponin T 788 > 721. EKG with nonspecific ST-T wave changes. CTA chest negative for PE. IV heparin  was initiated and was stopped post cath yesterday.  Aspirin 324 mg x 1 dose Cardiology consulted.  10/20: S/p  left heart cath, coronary angiography and successful PCI to distal LCx with DES.  Cath results showed severe single-vessel CAD with 95% stenosis of distal LCx consistent with acute plaque rupture.  There  was also moderate, approaching severe disease involving the LAD and proximal/mid circumflex Post cath, patient on aspirin 81 mg daily, Brilinta 90 mg twice daily, atorvastatin 40 mg daily, carvedilol 6.25 mg twice daily Chest pain has resolved without recurrence. A1c 6.2.  LDL: 125. 2D echo has been done and results are pending. Cardiology sign off appreciated.  They have cleared her for discharge pending 2D echo results.  They have titrated up carvedilol to 12.5 mg twice daily.  Cardiology will arrange follow-up with cardiology APP in 7 to 10 days in [place] and Dr. Mady after that. ICM spoke with patient about assisting in finding a new PCP but patient told them that she will find a PCP of her own. Lipoprotein A results pending and can be followed up outpatient.  Essential hypertension BP elevated this morning.  Cardiology had increased carvedilol from 6.25 to 12.5 mg twice daily.  Ever with this dose, patient's blood pressure dropped to 88/51 briefly and then rebounded back up quickly.  Cardiology PA went by and reassessed patient.  I discussed with Dr. Court who recommended reducing carvedilol dose back to 6.25 mg twice daily until outpatient follow-up with cardiology.  He has also reviewed patient's 2D echo results as noted below.  He cleared patient for discharge home.  Dyslipidemia LDL 125.  Started on atorvastatin 40 mg daily this admission.  LFTs unremarkable except for low protein/albumin.  Periodically follow fasting lipids and LFTs as outpatient.   Prediabetes A1c 6.2. Outpatient follow-up.   Anemia Hemoglobin dropped from 12.2 precath to 10.5 g Intracath.  No reported bleeding Hemoglobin stable.  Outpatient follow-up.   History of endometrial cancer s/p TAH, BSO 06/2015, followed by chemoradiation Reported to be in remission,   Consultations: Cardiology  Procedures: As above   Discharge Instructions  Discharge Instructions     Amb Referral to Cardiac  Rehabilitation   Complete by: As directed    Diagnosis:  Coronary Stents NSTEMI     After initial evaluation and assessments completed: Virtual Based Care may be provided alone or in conjunction with Phase 2 Cardiac Rehab based on patient barriers.: Yes   Intensive Cardiac Rehabilitation (ICR) [facility] location only OR Traditional Cardiac Rehabilitation (TCR) *If criteria for ICR are not met will enroll in TCR [facility] only): Yes   Call MD for:  difficulty breathing, headache or visual disturbances   Complete by: As directed    Call MD for:  extreme fatigue   Complete by: As directed    Call MD for:  persistant dizziness or light-headedness   Complete by: As directed    Call MD for:  persistant nausea and vomiting   Complete by: As directed    Call MD for:  severe uncontrolled pain   Complete by: As directed    Call MD for:  temperature >100.4   Complete by: As directed    Diet - low sodium heart healthy   Complete by: As directed  Increase activity slowly   Complete by: As directed         Medication List     STOP taking these medications    famotidine 20 MG tablet Commonly known as: PEPCID   guaiFENesin  600 MG 12 hr tablet Commonly known as: Mucinex    ibuprofen 600 MG tablet Commonly known as: ADVIL       TAKE these medications    acetaminophen  325 MG tablet Commonly known as: TYLENOL  Take 2 tablets (650 mg total) by mouth every 6 (six) hours as needed for mild pain (pain score 1-3), moderate pain (pain score 4-6), fever or headache. What changed:  when to take this reasons to take this   ascorbic acid 500 MG tablet Commonly known as: VITAMIN C Take 500 mg by mouth daily.   aspirin EC 81 MG tablet Take 1 tablet (81 mg total) by mouth daily. Swallow whole.   atorvastatin 40 MG tablet Commonly known as: LIPITOR Take 1 tablet (40 mg total) by mouth daily.   carvedilol 6.25 MG tablet Commonly known as: COREG Take 1 tablet (6.25 mg total) by mouth 2 (two) times  daily with a meal.   Centrum Silver tablet Take 1 tablet by mouth daily.   docusate sodium  100 MG capsule Commonly known as: COLACE Take 1 capsule (100 mg total) by mouth daily as needed for mild constipation or moderate constipation. What changed:  when to take this reasons to take this   pyridOXINE 50 MG tablet Commonly known as: VITAMIN B6 Take 50 mg by mouth daily.   Slow-Mag 64 MG Tbec SR tablet Generic drug: magnesium chloride Take 71.5 mg by mouth 2 (two) times daily. Reported on 12/17/2015   ticagrelor 90 MG Tabs tablet Commonly known as: BRILINTA Take 1 tablet (90 mg total) by mouth 2 (two) times daily.       No Known Allergies    Procedures/Studies: ECHOCARDIOGRAM COMPLETE Result Date: 03/28/2024    ECHOCARDIOGRAM REPORT   Patient Name:   Shiori Manukyan Date of Exam: 03/28/2024 Medical Rec #:  [id number]    Height:       62.2 in Accession #:    [id number]   Weight:       155.0 lb Date of Birth:  September 26, 1945    BSA:          1.719 m Patient Age:    78 years     BP:           149/57 mmHg Patient Gender: F            HR:           86 bpm. Exam Location:  Inpatient Procedure: 2D Echo, Color Doppler, Cardiac Doppler and Strain Analysis (Both            Spectral and Color Flow Doppler were utilized during procedure). Indications:    NSTEMI I24.4  History:        Patient has no prior history of Echocardiogram examinations.                 CAD.  Sonographer:    BERNARDA ROCKS Referring Phys: Deianira.Dane CHRISTOPHER END IMPRESSIONS  1. Left ventricular ejection fraction, by estimation, is 60 to 65%. The left ventricle has normal function. The left ventricle has no regional wall motion abnormalities. Left ventricular diastolic parameters were normal. There is moderate hypokinesis of  the left ventricular, basal-mid inferior wall. The average left ventricular global longitudinal strain is -20.8 %. The global longitudinal  strain is normal.  2. Right ventricular systolic function is normal. The  right ventricular size is normal. Tricuspid regurgitation signal is inadequate for assessing PA pressure.  3. The mitral valve is normal in structure. Mild mitral valve regurgitation. No evidence of mitral stenosis.  4. The aortic valve is tricuspid. There is mild calcification of the aortic valve. Aortic valve regurgitation is not visualized. Aortic valve sclerosis/calcification is present, without any evidence of aortic stenosis.  5. The inferior vena cava is normal in size with greater than 50% respiratory variability, suggesting right atrial pressure of 3 mmHg. FINDINGS  Left Ventricle: Left ventricular ejection fraction, by estimation, is 60 to 65%. The left ventricle has normal function. The left ventricle has no regional wall motion abnormalities. Moderate hypokinesis of the left ventricular, basal-mid inferior wall.  The average left ventricular global longitudinal strain is -20.8 %. Strain was performed and the global longitudinal strain is normal. The left ventricular internal cavity size was normal in size. There is no left ventricular hypertrophy. Left ventricular diastolic parameters were normal.  LV Wall Scoring: The inferior wall is hypokinetic. Right Ventricle: The right ventricular size is normal. No increase in right ventricular wall thickness. Right ventricular systolic function is normal. Tricuspid regurgitation signal is inadequate for assessing PA pressure. Left Atrium: Left atrial size was normal in size. Right Atrium: Right atrial size was normal in size. Pericardium: There is no evidence of pericardial effusion. Mitral Valve: The mitral valve is normal in structure. Mild mitral valve regurgitation. No evidence of mitral valve stenosis. MV peak gradient, 9.0 mmHg. The mean mitral valve gradient is 4.0 mmHg. Tricuspid Valve: The tricuspid valve is normal in structure. Tricuspid valve regurgitation is not demonstrated. No evidence of tricuspid stenosis. Aortic Valve: The aortic valve is  tricuspid. There is mild calcification of the aortic valve. Aortic valve regurgitation is not visualized. Aortic valve sclerosis/calcification is present, without any evidence of aortic stenosis. Aortic valve mean gradient measures 5.0 mmHg. Aortic valve peak gradient measures 8.5 mmHg. Aortic valve area, by VTI measures 1.60 cm. Pulmonic Valve: The pulmonic valve was grossly normal. Pulmonic valve regurgitation is not visualized. No evidence of pulmonic stenosis. Aorta: The aortic root is normal in size and structure. Venous: The inferior vena cava is normal in size with greater than 50% respiratory variability, suggesting right atrial pressure of 3 mmHg. IAS/Shunts: No atrial level shunt detected by color flow Doppler.  LEFT VENTRICLE PLAX 2D LVIDd:         3.80 cm     Diastology LVIDs:         2.50 cm     LV e' medial:    6.74 cm/s LV PW:         0.90 cm     LV E/e' medial:  18.2 LV IVS:        0.90 cm     LV e' lateral:   8.70 cm/s LVOT diam:     1.70 cm     LV E/e' lateral: 14.1 LV SV:         52 LV SV Index:   30          2D Longitudinal Strain LVOT Area:     2.27 cm    2D Strain GLS Avg:     -20.8 %  LV Volumes (MOD) LV vol d, MOD A2C: 81.1 ml LV vol d, MOD A4C: 99.5 ml LV vol s, MOD A2C: 38.3 ml LV vol s, MOD A4C: 35.2 ml LV  SV MOD A2C:     42.8 ml LV SV MOD A4C:     99.5 ml LV SV MOD BP:      54.6 ml RIGHT VENTRICLE             IVC RV Basal diam:  2.70 cm     IVC diam: 1.50 cm RV S prime:     15.00 cm/s TAPSE (M-mode): 2.0 cm LEFT ATRIUM             Index        RIGHT ATRIUM          Index LA diam:        3.20 cm 1.86 cm/m   RA Area:     8.85 cm LA Vol (A2C):   42.5 ml 24.72 ml/m  RA Volume:   17.50 ml 10.18 ml/m LA Vol (A4C):   50.2 ml 29.20 ml/m LA Biplane Vol: 47.4 ml 27.57 ml/m  AORTIC VALVE                     PULMONIC VALVE AV Area (Vmax):    1.29 cm      PV Vmax:          0.99 m/s AV Area (Vmean):   1.00 cm      PV Peak grad:     3.9 mmHg AV Area (VTI):     1.60 cm      PR End Diast Vel:  0.73 msec AV Vmax:           146.00 cm/s AV Vmean:          110.000 cm/s AV VTI:            0.323 m AV Peak Grad:      8.5 mmHg AV Mean Grad:      5.0 mmHg LVOT Vmax:         83.00 cm/s LVOT Vmean:        48.300 cm/s LVOT VTI:          0.228 m LVOT/AV VTI ratio: 0.71  AORTA Ao Root diam: 2.60 cm Ao Asc diam:  2.85 cm MITRAL VALVE MV Area (PHT): 6.12 cm     SHUNTS MV Area VTI:   1.98 cm     Systemic VTI:  0.23 m MV Peak grad:  9.0 mmHg     Systemic Diam: 1.70 cm MV Mean grad:  4.0 mmHg MV Vmax:       1.50 m/s MV Vmean:      87.4 cm/s MV Decel Time: 124 msec MR Peak grad: 88.2 mmHg MR Vmax:      469.67 cm/s MV E velocity: 123.00 cm/s MV A velocity: 128.00 cm/s MV E/A ratio:  0.96 Mihai Croitoru MD Electronically signed by Jerel Balding MD Signature Date/Time: 03/28/2024/12:46:31 PM    Final    CARDIAC CATHETERIZATION Result Date: 03/27/2024 Conclusions: Severe single-vessel coronary artery disease with 95% stenosis of distal LCx (dominant vessel) consistent with acute plaque rupture.  There is also moderate, approaching severe disease involving the LAD and proximal/mid LCx. Preserved left ventricular systolic function with subtle basal inferior hypokinesis (LVEF 55-65%).  Mildly elevated left ventricular filling pressure (LVEDP 19 mmHg). Successful PCI to distal LCx using Synergy XD 2.25 x 12 mm drug-eluting stent with 0% residual stenosis and TIMI-3 flow. Recommendations: Dual antiplatelet therapy with aspirin and ticagrelor for at least 12 months. Aggressive secondary prevention of coronary artery disease. Favor medical therapy for residual LAD and  LCx disease. Follow-up echocardiogram. [name], MD [facility]  CT Angio Chest PE W and/or Wo Contrast Result Date: 03/26/2024 CLINICAL DATA:  Concern for pulmonary embolism. EXAM: CT ANGIOGRAPHY CHEST WITH CONTRAST TECHNIQUE: Multidetector CT imaging of the chest was performed using the standard protocol during bolus administration of intravenous  contrast. Multiplanar CT image reconstructions and MIPs were obtained to evaluate the vascular anatomy. RADIATION DOSE REDUCTION: This exam was performed according to the departmental dose-optimization program which includes automated exposure control, adjustment of the mA and/or kV according to patient size and/or use of iterative reconstruction technique. CONTRAST:  75mL OMNIPAQUE  IOHEXOL  350 MG/ML SOLN COMPARISON:  Chest CT dated 05/24/2015. Radiograph dated 03/26/2024. FINDINGS: Cardiovascular: There is no cardiomegaly or pericardial effusion. There is coronary vascular calcification. Moderate calcified and noncalcified plaque of the thoracic aorta. No aneurysmal dilatation or dissection. A 9 mm intraluminal low-attenuation in the descending thoracic aorta (75/4), likely a noncalcified plaque. No pulmonary artery embolus identified. Mediastinum/Nodes: No hilar or mediastinal adenopathy. The esophagus is grossly unremarkable no mediastinal fluid collection. Right-sided Port-A-Cath with tip at the cavoatrial junction. Lungs/Pleura: No focal consolidation, pleural effusion, or pneumothorax. The central airways are patent. Upper Abdomen: Fatty liver. Musculoskeletal: No acute osseous pathology. Review of the MIP images confirms the above findings. IMPRESSION: 1. No acute intrathoracic pathology. No CT evidence of pulmonary artery embolus. 2. Fatty liver. 3.  Aortic Atherosclerosis (ICD10-I70.0). Electronically Signed   By: Vanetta Chou M.D.   On: 03/26/2024 18:36   DG Chest Port 1 View Result Date: 03/26/2024 CLINICAL DATA:  Intermittent chest pain since Thursday, hypertension, abnormal EKG EXAM: PORTABLE CHEST 1 VIEW COMPARISON:  06/16/2018 FINDINGS: Single frontal view of the chest demonstrates stable right chest wall port. Cardiac silhouette is unremarkable. No airspace disease, effusion, or pneumothorax. The fibrillator pad overlies the upper chest. No acute bony abnormalities. IMPRESSION: 1. No acute  intrathoracic process. Electronically Signed   By: Ozell Daring M.D.   On: 03/26/2024 17:31      Subjective: Patient denies complaints.  No chest pain or back soreness.  She has been advised by the cardiology team that she should avoid her jazz exercise or strenuous activity until clearance by them.  Discharge Exam:  Vitals:   03/28/24 1157 03/28/24 1200 03/28/24 1249 03/28/24 1316  BP: (!) 88/51 (!) 96/46 105/63 (!) 114/51  Pulse: 76     Resp: 18  16   Temp: 98.2 F (36.8 C)     TempSrc: Oral     SpO2: 97%     Weight:      Height:        General: Pleasant elderly female, moderately built and nourished sitting up comfortably in chair without distress. Cardiovascular: S1 & S2 heard, RRR, S1/S2 +. No murmurs, rubs, gallops or clicks. No JVD or pedal edema.  Telemetry personally reviewed: Sinus rhythm. Respiratory: Clear to auscultation without wheezing, rhonchi or crackles. No increased work of breathing. Abdominal:  Non distended, non tender & soft. No organomegaly or masses appreciated. Normal bowel sounds heard. CNS: Alert and oriented. No focal deficits.  Right wrist cath site without acute findings. Extremities: no edema, no cyanosis    The results of significant diagnostics from this hospitalization (including imaging, microbiology, ancillary and laboratory) are listed below for reference.     Microbiology: No results found for this or any previous visit (from the past 240 hours).   Labs: CBC: Recent Labs  Lab 03/26/24 1642 03/27/24 0247 03/27/24 1330 03/28/24 0348  WBC  11.4* 11.2*  --  9.5  HGB 12.2 12.2 10.5* 11.1*  HCT 37.6 37.2 31.0* 34.1*  MCV 87.2 85.9  --  87.2  PLT 229 240  --  244    Basic Metabolic Panel: Recent Labs  Lab 03/26/24 1642 03/27/24 1330 03/28/24 0348  NA 139 135 139  K 3.7 3.5 3.5  CL 102  --  107  CO2 22  --  22  GLUCOSE 138*  --  125*  BUN 13  --  11  CREATININE 0.76  --  0.79  CALCIUM 10.1  --  9.0    Liver Function  Tests: Recent Labs  Lab 03/28/24 0348  AST 33  ALT 23  ALKPHOS 49  BILITOT 1.1  PROT 5.9*  ALBUMIN 2.9*    CBG: No results for input(s): GLUCAP in the last 168 hours.  Hgb A1c Recent Labs    03/27/24 1620  HGBA1C 6.2*    Lipid Profile Recent Labs    03/27/24 1620  CHOL 200  HDL 49  LDLCALC 125*  TRIG 129  CHOLHDL 4.1     Time coordinating discharge: 35 minutes  SIGNED:  Trenda Mar, MD,  FACP, [facility], [facility], [facility]   [facility] [place]     To contact the attending provider between 7A-7P or the covering provider during after hours 7P-7A, please log into the web site [email or web address] and access using universal [place] password for that web site. If you do not have the password, please call the hospital operator.

## 2024-03-28 NOTE — Progress Notes (Signed)
 Rounding Note   Patient Name: Cindy Anderson Date of Encounter: 03/28/2024  [facility] Cardiologist: Dr. Mady   Subjective Postop day 1 PCI and stent distal dominant circumflex by Dr. Mady in the setting of non-STEMI.  The patient is asymptomatic.  Scheduled Meds:  aspirin EC  81 mg Oral Daily   atorvastatin  40 mg Oral Daily   carvedilol  6.25 mg Oral BID WC   enoxaparin (LOVENOX) injection  40 mg Subcutaneous Q24H   sodium chloride  flush  3 mL Intravenous Q12H   sodium chloride  flush  3 mL Intravenous Q12H   ticagrelor  90 mg Oral BID   Continuous Infusions:  sodium chloride      PRN Meds: sodium chloride , acetaminophen , albuterol, ondansetron  (ZOFRAN ) IV, sodium chloride  flush   Vital Signs  Vitals:   03/27/24 2236 03/28/24 0041 03/28/24 0534 03/28/24 0730  BP: (!) 122/53 (!) 133/46 (!) 149/57   Pulse: 92  80 81  Resp:  20 17   Temp:   98.6 F (37 C)   TempSrc:   Oral   SpO2: 95%  96% 93%  Weight:      Height:        Intake/Output Summary (Last 24 hours) at 03/28/2024 0814 Last data filed at 03/27/2024 1941 Gross per 24 hour  Intake 860 ml  Output --  Net 860 ml      03/26/2024    6:31 PM 06/16/2018    8:52 AM 09/23/2016    1:23 PM  Last 3 Weights  Weight (lbs) 155 lb 146 lb 138 lb 6.4 oz  Weight (kg) 70.308 kg 66.225 kg 62.778 kg      Telemetry Normal sinus rhythm- Personally Reviewed  ECG  Normal sinus rhythm 81 with nonspecific ST and T wave changes- Personally Reviewed  Physical Exam  GEN: No acute distress.   Neck: No JVD Cardiac: RRR, no murmurs, rubs, or gallops.  Respiratory: Clear to auscultation bilaterally. GI: Soft, nontender, non-distended  MS: No edema; No deformity. Neuro:  Nonfocal  Psych: Normal affect   Labs High Sensitivity Troponin:  No results for input(s): TROPONINIHS in the last 720 hours.   Chemistry Recent Labs  Lab 03/26/24 1642 03/27/24 1330 03/28/24 0348  NA 139 135 139  K 3.7 3.5 3.5  CL 102   --  107  CO2 22  --  22  GLUCOSE 138*  --  125*  BUN 13  --  11  CREATININE 0.76  --  0.79  CALCIUM 10.1  --  9.0  PROT  --   --  5.9*  ALBUMIN  --   --  2.9*  AST  --   --  33  ALT  --   --  23  ALKPHOS  --   --  49  BILITOT  --   --  1.1  GFRNONAA >60  --  >60  ANIONGAP 15  --  10    Lipids  Recent Labs  Lab 03/27/24 1620  CHOL 200  TRIG 129  HDL 49  LDLCALC 125*  CHOLHDL 4.1    Hematology Recent Labs  Lab 03/26/24 1642 03/27/24 0247 03/27/24 1330 03/28/24 0348  WBC 11.4* 11.2*  --  9.5  RBC 4.31 4.33  --  3.91  HGB 12.2 12.2 10.5* 11.1*  HCT 37.6 37.2 31.0* 34.1*  MCV 87.2 85.9  --  87.2  MCH 28.3 28.2  --  28.4  MCHC 32.4 32.8  --  32.6  RDW 14.6  14.6  --  14.6  PLT 229 240  --  244   Thyroid No results for input(s): TSH, FREET4 in the last 168 hours.  BNPNo results for input(s): BNP, PROBNP in the last 168 hours.  DDimer No results for input(s): DDIMER in the last 168 hours.   Radiology  CARDIAC CATHETERIZATION Result Date: 03/27/2024 Conclusions: Severe single-vessel coronary artery disease with 95% stenosis of distal LCx (dominant vessel) consistent with acute plaque rupture.  There is also moderate, approaching severe disease involving the LAD and proximal/mid LCx. Preserved left ventricular systolic function with subtle basal inferior hypokinesis (LVEF 55-65%).  Mildly elevated left ventricular filling pressure (LVEDP 19 mmHg). Successful PCI to distal LCx using Synergy XD 2.25 x 12 mm drug-eluting stent with 0% residual stenosis and TIMI-3 flow. Recommendations: Dual antiplatelet therapy with aspirin and ticagrelor for at least 12 months. Aggressive secondary prevention of coronary artery disease. Favor medical therapy for residual LAD and LCx disease. Follow-up echocardiogram. [name], MD [facility]  CT Angio Chest PE W and/or Wo Contrast Result Date: 03/26/2024 CLINICAL DATA:  Concern for pulmonary embolism. EXAM: CT ANGIOGRAPHY  CHEST WITH CONTRAST TECHNIQUE: Multidetector CT imaging of the chest was performed using the standard protocol during bolus administration of intravenous contrast. Multiplanar CT image reconstructions and MIPs were obtained to evaluate the vascular anatomy. RADIATION DOSE REDUCTION: This exam was performed according to the departmental dose-optimization program which includes automated exposure control, adjustment of the mA and/or kV according to patient size and/or use of iterative reconstruction technique. CONTRAST:  75mL OMNIPAQUE  IOHEXOL  350 MG/ML SOLN COMPARISON:  Chest CT dated 05/24/2015. Radiograph dated 03/26/2024. FINDINGS: Cardiovascular: There is no cardiomegaly or pericardial effusion. There is coronary vascular calcification. Moderate calcified and noncalcified plaque of the thoracic aorta. No aneurysmal dilatation or dissection. A 9 mm intraluminal low-attenuation in the descending thoracic aorta (75/4), likely a noncalcified plaque. No pulmonary artery embolus identified. Mediastinum/Nodes: No hilar or mediastinal adenopathy. The esophagus is grossly unremarkable no mediastinal fluid collection. Right-sided Port-A-Cath with tip at the cavoatrial junction. Lungs/Pleura: No focal consolidation, pleural effusion, or pneumothorax. The central airways are patent. Upper Abdomen: Fatty liver. Musculoskeletal: No acute osseous pathology. Review of the MIP images confirms the above findings. IMPRESSION: 1. No acute intrathoracic pathology. No CT evidence of pulmonary artery embolus. 2. Fatty liver. 3.  Aortic Atherosclerosis (ICD10-I70.0). Electronically Signed   By: Vanetta Chou M.D.   On: 03/26/2024 18:36   DG Chest Port 1 View Result Date: 03/26/2024 CLINICAL DATA:  Intermittent chest pain since Thursday, hypertension, abnormal EKG EXAM: PORTABLE CHEST 1 VIEW COMPARISON:  06/16/2018 FINDINGS: Single frontal view of the chest demonstrates stable right chest wall port. Cardiac silhouette is  unremarkable. No airspace disease, effusion, or pneumothorax. The fibrillator pad overlies the upper chest. No acute bony abnormalities. IMPRESSION: 1. No acute intrathoracic process. Electronically Signed   By: Ozell Daring M.D.   On: 03/26/2024 17:31    Cardiac Studies   Cardiac catheterization/PCI and stent (03/27/2024)  Conclusion  Conclusions: Severe single-vessel coronary artery disease with 95% stenosis of distal LCx (dominant vessel) consistent with acute plaque rupture.  There is also moderate, approaching severe disease involving the LAD and proximal/mid LCx. Preserved left ventricular systolic function with subtle basal inferior hypokinesis (LVEF 55-65%).  Mildly elevated left ventricular filling pressure (LVEDP 19 mmHg). Successful PCI to distal LCx using Synergy XD 2.25 x 12 mm drug-eluting stent with 0% residual stenosis and TIMI-3 flow.   Recommendations: Dual antiplatelet therapy with  aspirin and ticagrelor for at least 12 months. Aggressive secondary prevention of coronary artery disease. Favor medical therapy for residual LAD and LCx disease. Follow-up echocardiogram.   [name], MD [facility] Coronary Diagrams  Diagnostic Dominance: Left  Intervention      Patient Profile    Cindy Anderson is a 78 y.o. female with a hx of endometrial cancer (in remission since 2020) s/p hysterectomy who is being seen 03/27/2024 for the evaluation of NSTEMI at the request of Dr. Shona.   Assessment & Plan   1: Non-STEMI-patient admitted with a Troponin peaked at 788.  She underwent cardiac catheterization in the right radial approach by Dr. Hanson yesterday revealing high-grade mid to distal dominant circumflex disease which was stented using a Synergy 2.25 x 12 mm long drug-eluting stent with excellent result.  She had residual moderate nonflow-limiting disease.  Her LV function was essentially normal by ventriculography.  She is on DAPT with aspirin and Brilinta.   Plan 12 months of uninterrupted DAPT.  Okay for discharge later today after 2D echo.  Follow-up with APP in [place] in 7 to 10 days and with Dr. Hanson in [place] in 6 weeks.  2: Essential hypertension-blood pressure mildly elevated at 149/57.  On carvedilol 6.25 mg p.o. twice daily.  Can uptitrate to 12.5 mg a day.  Can further evaluate as an outpatient.  3: Hyperlipidemia-LDL on admission was 125.  Started on atorvastatin 40 mg a day.  LDL goal less than 55.  This can be further titrated as an outpatient.  Postop day 1 PCI and stent distal dominant circumflex in setting of non-STEMI.  Normal LV function by ventriculography.  2D echo pending.  Patient is asymptomatic.  Okay for discharge from our point of view later today.  Will arrange outpatient APP follow-up in [place] follow-up followed by offices with Dr. Hanson.   [facility] will sign off.   The patient is ready for discharge today from a cardiac standpoint. Medication Recommendations: DAPT with aspirin and Brilinta, carvedilol, atorvastatin. Other recommendations (labs, testing, etc): Awaiting results of 2D echo after which she can be discharged. Follow up as an outpatient: Follow-up with APP in 7 to 10 days in [place] and with Dr. Hanson after that. For questions or updates, please contact [facility] Please consult [email or web address] for contact info under       Signed, Dorn Lesches, MD  03/28/2024, 8:14 AM

## 2024-03-28 NOTE — Progress Notes (Addendum)
 CARDIAC REHAB PHASE I   PRE:  Rate/Rhythm: 85 SR 1st degree?   BP:  Sitting: 179/68      SpO2:  95 RA   Pt has had several systolic BP readings in the 170s at rest. She has not been given her medicines yet. Will defer ambulation at this time d/t uncontrolled HTN. She was asymptomatic throughout session and denies chest tightness or anginal equivalent this morning.   Pt was educated on NSTEMI, stent card, stent location, Antiplatelet and ASA use, wt restrictions, no baths/daily wash-ups, s/s of infection, ex guidelines, s/s to stop exercising, NTG use and calling 911, heart healthy diet, risk factors and CRPII. Pt received MI book and materials on exercise, diet, and CRPII. Will refer to [facility].   Pt is interested in program at [facility]. [facility] over [facility] d/t spending majority of her time in [place] with husband.    Garen FORBES Candy  MS, ACSM-CEP 9:48 AM 03/28/2024    Service time is from 0908 to 0948.

## 2024-03-28 NOTE — Plan of Care (Signed)
  Problem: Clinical Measurements: Goal: Respiratory complications will improve Outcome: Progressing Goal: Cardiovascular complication will be avoided Outcome: Progressing   Problem: Activity: Goal: Risk for activity intolerance will decrease Outcome: Progressing   Problem: Coping: Goal: Level of anxiety will decrease Outcome: Progressing   Problem: Elimination: Goal: Will not experience complications related to bowel motility Outcome: Progressing Goal: Will not experience complications related to urinary retention Outcome: Progressing   Problem: Safety: Goal: Ability to remain free from injury will improve Outcome: Progressing   Problem: Activity: Goal: Ability to return to baseline activity level will improve Outcome: Progressing   Problem: Cardiovascular: Goal: Ability to achieve and maintain adequate cardiovascular perfusion will improve Outcome: Progressing

## 2024-03-29 ENCOUNTER — Telehealth (HOSPITAL_COMMUNITY): Payer: Self-pay

## 2024-03-29 LAB — LIPOPROTEIN A (LPA): Lipoprotein (a): 167.3 nmol/L — ABNORMAL HIGH (ref <75.0)

## 2024-03-29 NOTE — Telephone Encounter (Signed)
 Pt insurance is active and benefits verified through Medicare a/b Co-pay 0, DED $257/0 met, out of pocket 0/0 met, co-insurance 20%. nlo pre-authorization required. Passport, 03/29/2024@12 :10, REF# [id number]  2ndary insurance is active and benefits verified through [facility]. Co-pay 0, DED 0/0 met, out of pocket 0/0 met, co-insurance 0%. No pre-authorization required, [place]/[employer] 03/29/2024@11 :45, REF# [id number]  TCR/ICR? ICR Visit(date of service)limitation? 72 limit Can multiple codes be used on the same date of service/visit?(IF ITS A LIMIT) n/a  Is this a lifetime maximum or an annual maximum? annual Has the member used any of these services to date? no Is there a time limit (weeks/months) on start of program and/or program completion? no  Will contact patient to see if she is interested in the Cardiac Rehab Program. If interested, patient will need to complete follow up appt. Once completed, patient will be contacted for scheduling upon review by the RN Navigator.

## 2024-03-29 NOTE — Telephone Encounter (Signed)
 Called patient to see if she is interested in the Cardiac Rehab Program. Patient expressed interest. Explained scheduling process and went over insurance, patient verbalized understanding. Will contact patient for scheduling once f/u has been completed.

## 2024-03-29 NOTE — Telephone Encounter (Signed)
 Pt insurance is active and benefits verified through Medicare a/b Co-pay 0, DED $257/$0 met, out of pocket 0/0 met, co-insurance 20%. no pre-authorization required. Passport, 03/29/2024@12 :10, REF# [id number]  2ndary insurance is active and benefits verified through [facility]. Co-pay 0, DED 0/0 met, out of pocket 0/0 met, co-insurance 0%. No pre-authorization required, [place]/[employer] 03/29/24@11 :45, REF# [id number]  TCR/ICR? ICR Visit(date of service)limitation? 72 Can multiple codes be used on the same date of service/visit?(IF ITS A LIMIT) yes  Is this a lifetime maximum or an annual maximum? annual Has the member used any of these services to date? no Is there a time limit (weeks/months) on start of program and/or program completion? no  Will contact patient to see if she is interested in the Cardiac Rehab Program. If interested, patient will need to complete follow up appt. Once completed, patient will be contacted for scheduling upon review by the RN Navigator.

## 2024-03-30 ENCOUNTER — Other Ambulatory Visit: Payer: Self-pay | Admitting: Internal Medicine

## 2024-03-30 ENCOUNTER — Telehealth: Payer: Self-pay | Admitting: Medical

## 2024-03-30 MED ORDER — NITROGLYCERIN 0.4 MG SL SUBL
0.4000 mg | SUBLINGUAL_TABLET | SUBLINGUAL | 99 refills | Status: DC | PRN
Start: 2024-03-30 — End: 2024-04-07

## 2024-03-30 NOTE — Telephone Encounter (Signed)
 Called and spoke with the patient regarding her questions post cath.  Patient stated that her insertion site bandage had started to come loose and she wasn't sure if she should take it off or not.  Advised the patient that, since she was discharged on Tuesday afternoon, she is approaching the end of the 48-hour period that she is supposed to keep the site dry.  Patient advised to gently clean the insertion site using a warm, wet, washcloth, and apply a new bandage that was provided at discharge, tonight prior to going to bed.  Patient also asked about limiting activity with that arm.  Patient advised to limit strenuous activity, but she can increase activity as she able to tolerate it.  Patient verbalized understanding and expressed gratitude for the phone call.  All questions and concerns addressed at this time.

## 2024-03-30 NOTE — Telephone Encounter (Signed)
 Pt would like a c/b from a nurse regarding post cath instructions please advise

## 2024-04-07 ENCOUNTER — Ambulatory Visit: Attending: Medical | Admitting: Medical

## 2024-04-07 ENCOUNTER — Encounter: Payer: Self-pay | Admitting: Medical

## 2024-04-07 VITALS — BP 148/64 | HR 71 | Ht 61.0 in | Wt 151.2 lb

## 2024-04-07 DIAGNOSIS — I214 Non-ST elevation (NSTEMI) myocardial infarction: Secondary | ICD-10-CM | POA: Insufficient documentation

## 2024-04-07 DIAGNOSIS — R011 Cardiac murmur, unspecified: Secondary | ICD-10-CM | POA: Diagnosis present

## 2024-04-07 DIAGNOSIS — Z79899 Other long term (current) drug therapy: Secondary | ICD-10-CM | POA: Insufficient documentation

## 2024-04-07 DIAGNOSIS — E782 Mixed hyperlipidemia: Secondary | ICD-10-CM | POA: Diagnosis present

## 2024-04-07 DIAGNOSIS — I10 Essential (primary) hypertension: Secondary | ICD-10-CM | POA: Diagnosis present

## 2024-04-07 DIAGNOSIS — I2510 Atherosclerotic heart disease of native coronary artery without angina pectoris: Secondary | ICD-10-CM | POA: Diagnosis present

## 2024-04-07 MED ORDER — NITROGLYCERIN 0.4 MG SL SUBL: 0.4000 mg | SUBLINGUAL_TABLET | SUBLINGUAL | 3 refills | Status: AC | PRN

## 2024-04-07 NOTE — Progress Notes (Signed)
 Cardiology Office Note   Date:  04/07/2024  ID:  Cindy Anderson, DOB 02/09/1946, MRN [id number] PCP: Pcp, No  [facility] Providers Cardiologist:  None    History of Present Illness Cindy Anderson is a 78 y.o. female with a h/o endometrial cancer (in remission since 2020)s/p hysterectomy, CAD who presents for hospital follow-up.   Patient was admitted in late October 2025 with non-STEMI.  High-sensitivity troponin peaked at 788.  She underwent cardiac cath revealing high-grade mid to distal dominant circumflex disease which was stented x 1.  She had residual moderate nonflow-limiting disease.  LV function was essentially normal by ventriculography.  She was started on DAPT with aspirin and Brilinta x 12 months.  Today, the patient is overall doing well. Patient has multiple questions about labs and medications. Blood pressures are mildly elevated at home, but she would not like to make changes at this time. 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 She denies chest pain, SOB, lower leg edema, lightheadednedd, syncope, orthopnea, pnd, heart racing, palpitations. Cath site is healing well. She denies bleeding issues.   Studies Reviewed EKG Interpretation Date/Time:  Friday April 07 2024 14:26:04 EDT Ventricular Rate:  71 PR Interval:  142 QRS Duration:  94 QT Interval:  398 QTC Calculation: 432 R Axis:   -17  Text Interpretation: Normal sinus rhythm T wave abnormality, consider inferior ischemia T wave abnormality, consider anterolateral ischemia When compared with ECG of 28-Mar-2024 05:47, Non-specific change in ST segment in Lateral leads T wave inversion now evident in Inferior leads T wave inversion now evident in Anterolateral leads Confirmed by Franchester, Dessire Grimes ([id number]) on 04/07/2024 2:28:22 PM    Cardiac catheterization/PCI and stent (03/27/2024)   Conclusion   Conclusions: Severe single-vessel coronary artery disease with 95%  stenosis of distal LCx (dominant vessel) consistent with acute plaque rupture.  There is also moderate, approaching severe disease involving the LAD and proximal/mid LCx. Preserved left ventricular systolic function with subtle basal inferior hypokinesis (LVEF 55-65%).  Mildly elevated left ventricular filling pressure (LVEDP 19 mmHg). Successful PCI to distal LCx using Synergy XD 2.25 x 12 mm drug-eluting stent with 0% residual stenosis and TIMI-3 flow.   Recommendations: Dual antiplatelet therapy with aspirin and ticagrelor for at least 12 months. Aggressive secondary prevention of coronary artery disease. Favor medical therapy for residual LAD and LCx disease. Follow-up echocardiogram.   Cindy Hanson, MD [facility] Coronary Diagrams   Diagnostic Dominance: Left  Intervention        Echo 03/2024 1. Left ventricular ejection fraction, by estimation, is 60 to 65%. The  left ventricle has normal function. The left ventricle has no regional  wall motion abnormalities. Left ventricular diastolic parameters were  normal. There is moderate hypokinesis of   the left ventricular, basal-mid inferior wall. The average left  ventricular global longitudinal strain is -20.8 %. The global longitudinal  strain is normal.   2. Right ventricular systolic function is normal. The right ventricular  size is normal. Tricuspid regurgitation signal is inadequate for assessing  PA pressure.   3. The mitral valve is normal in structure. Mild mitral valve  regurgitation. No evidence of mitral stenosis.   4. The aortic valve is tricuspid. There is mild calcification of the  aortic valve. Aortic valve regurgitation is not visualized. Aortic valve  sclerosis/calcification is present, without any evidence of aortic  stenosis.   5. The inferior vena cava is normal in size with greater than  50%  respiratory variability, suggesting right atrial pressure of 3 mmHg.      Physical Exam VS:  BP (!)  148/64 (BP Location: Left Arm, Patient Position: Sitting, Cuff Size: Normal)   Pulse 71   Ht 5' 1 (1.549 m)   Wt 151 lb 4 oz (68.6 kg)   SpO2 98%   BMI 28.58 kg/m        Wt Readings from Last 3 Encounters:  04/07/24 151 lb 4 oz (68.6 kg)  03/26/24 155 lb (70.3 kg)  06/16/18 146 lb (66.2 kg)    GEN: Well nourished, well developed in no acute distress NECK: No JVD; No carotid bruits CARDIAC: RRR, no murmurs, rubs, gallops RESPIRATORY:  Clear to auscultation without rales, wheezing or rhonchi  ABDOMEN: Soft, non-tender, non-distended EXTREMITIES:  trace lowerleg edema; No deformity   ASSESSMENT AND PLAN  NSTEMI/CAD s/p PDI/DES x 1 LCx Recent NSTEMI admission with HS troponin up to 788. Cardiac cath showed high-grade mid to distal dominant circumflex disease which was stented with DES x1. There was otherwise moderate nonobstructive disease. She was started on DAPT with ASA and Brilinta x 12 months. Echo showed normal LVEF. The patient is overall doing well with no chest pain or SOB. Cath site healed well. I will refer to Cardiac rehab in [place] pet patient request. CBC today. Recommended she slowly get back into activity.   HTN BP is mildly elevated. Patient does not want to increase Coreg at this time. Continue Coreg 6.25mg BID.   HLD LDL 125, goal<70. Continue Lipitor 40mg  daily. Repeat lipid panel in 6-8 weeks.   Murmur Echo showed normal LVEF, mild MR and aortic valve sclerosis/calcification.    Cardiac Rehabilitation Eligibility Assessment  The patient is ready to start cardiac rehabilitation from a cardiac standpoint.       Dispo: Follow-up in 1 month  Signed, Cheyne Boulden VEAR Fishman, PA-C

## 2024-04-07 NOTE — Patient Instructions (Signed)
 Medication Instructions:  Your physician recommends that you continue on your current medications as directed. Please refer to the Current Medication list given to you today.    *If you need a refill on your cardiac medications before your next appointment, please call your pharmacy*  Lab Work: Your provider would like for you to have following labs drawn today CBC.     Testing/Procedures: No test ordered today   Follow-Up: At [facility], you and your health needs are our priority.  As part of our continuing mission to provide you with exceptional heart care, our providers are all part of one team.  This team includes your primary Cardiologist (physician) and Advanced Practice Providers or APPs (Physician Assistants and Nurse Practitioners) who all work together to provide you with the care you need, when you need it.  Your next appointment:   1 month(s)  Provider:   Mikey Fishman, PA-C

## 2024-04-08 LAB — CBC
Hematocrit: 36.9 % (ref 34.0–46.6)
Hemoglobin: 12 g/dL (ref 11.1–15.9)
MCH: 28 pg (ref 26.6–33.0)
MCHC: 32.5 g/dL (ref 31.5–35.7)
MCV: 86 fL (ref 79–97)
Platelets: 368 x10E3/uL (ref 150–450)
RBC: 4.28 x10E6/uL (ref 3.77–5.28)
RDW: 13.5 % (ref 11.7–15.4)
WBC: 9.7 x10E3/uL (ref 3.4–10.8)

## 2024-04-10 ENCOUNTER — Ambulatory Visit: Payer: Self-pay | Admitting: Medical

## 2024-04-11 ENCOUNTER — Encounter (HOSPITAL_COMMUNITY): Payer: Self-pay

## 2024-04-11 ENCOUNTER — Telehealth (HOSPITAL_COMMUNITY): Payer: Self-pay

## 2024-04-11 NOTE — Telephone Encounter (Signed)
 Attempted to call patient to schedule cardiac rehab- no answer, left message. Sent MyChart message.

## 2024-04-12 ENCOUNTER — Telehealth (HOSPITAL_COMMUNITY): Payer: Self-pay

## 2024-04-12 NOTE — Telephone Encounter (Signed)
 Attempted to call patient to see about moving orientation time from 10:15 to 10:45- no answer, left message. Sent MyChart message.

## 2024-04-17 ENCOUNTER — Encounter (HOSPITAL_COMMUNITY)
Admission: RE | Admit: 2024-04-17 | Discharge: 2024-04-17 | Disposition: A | Discharge status: Home / Self Care | Source: Ambulatory Visit | Attending: Internal Medicine | Admitting: Internal Medicine

## 2024-04-17 VITALS — BP 162/76 | HR 70 | Ht 61.0 in | Wt 153.7 lb

## 2024-04-17 DIAGNOSIS — I214 Non-ST elevation (NSTEMI) myocardial infarction: Secondary | ICD-10-CM | POA: Diagnosis present

## 2024-04-17 DIAGNOSIS — Z955 Presence of coronary angioplasty implant and graft: Secondary | ICD-10-CM | POA: Diagnosis present

## 2024-04-17 NOTE — Progress Notes (Signed)
 Cardiac Rehab Medication Review   Does the patient  feel that his/her medications are working for him/her? Yes    Has the patient been experiencing any side effects to the medications prescribed? No  Does the patient measure his/her own blood pressure or blood glucose at home?   Yes  Does the patient have any problems obtaining medications due to transportation or finances?   No  Understanding of regimen: excellent Understanding of indications: excellent Potential of compliance: excellent    Comments: Lilyona understands her medications and regime well. She checks her BP daily.    Con KATHEE Pereyra, MS, ACSM-CEP 04/17/2024 12:00 PM

## 2024-04-17 NOTE — Progress Notes (Addendum)
 Pt in this morning for cardiac rehab orientation s/p 03/27/24 NSTEMI and DES LCirc.  Pt Bp checked 162/76.  Pt reports taking her morning medication which includes Coreg 6.25.  pt is not on any additional medications for HT.  Pt had one cup of coffee. Reviewed other medications denies any allergy medication.  Pt and husband report bp at home some running high others reading low along with irregular HR.  Had pt rest and relax.  Recheck remains 162/76 in both arms.  Ok to proceed with walk test as this bp is below the cut off for bp per departmental guidelines.  Pt completed 6 minute walk noted  frequent PVC more prevalent during the walk test and runs of bigeminy and trigeminy.  BP 202/84 pt asymptomatic.  Rechecked after 5 minutes bp return to 160/72. These are posted in Media for review.  Conferred with supervising provider - Barnie Hila NP.  Reviewed last office visit with Kreg Fishman on 10/31 BP 148/64.  At that time, advised to increase the coreg dose to 12.5 twice a day.  At that time both pt and husband were hesitant to make the change therefore the dose remained the same. Per Barnie Hila NP  recommendations that this pt should refer back to previous follow up advisement to increase coreg dose to 12.5 twice a day.  Both pt and husband feel hesitant and asked lots of questions concerned that they will run out as a result of the increase.  Pt husband particular concerned due to the amount  Reassured pt and husband, questions answered assured pt that a new prescription can be placed.  Reviewed with pt and wife signs of hypotension such as dizziness, light headed, feeling faint confused seeing spots -this was a worry to them. Pt to continue to take her BP specifically after 1 hour after taking Coreg in the morning and evening.  Write the bp down in journal the time of day.  Pt plans to purchase new cuff to use.  Advised pt to please bring cuff in and we can verify the accuracy compared to manual  bp reading. Pt verbalized understanding and has plan for taking her medications prior to the 8:00 exercise class. Will route this note for cosignature by supervising provider and review by Cadence Furth PA. Saturnino Bernett PEAK, BSN Cardiac and Emergency Planning/management Officer

## 2024-04-17 NOTE — Progress Notes (Signed)
 Cardiac Individual Treatment Plan  Patient Details  Name: Cindy Anderson MRN: [id number] Date of Birth: Sep 13, 1945 Referring Provider:   Flowsheet Row INTENSIVE CARDIAC REHAB ORIENT from 04/17/2024 in [facility]  Referring Provider [name], MD    Initial Encounter Date:  Flowsheet Row INTENSIVE CARDIAC REHAB ORIENT from 04/17/2024 in [facility]  Date 04/17/24    Visit Diagnosis: 03/27/24 NSTEMI (non-ST elevated myocardial infarction) (HCC)  03/27/24 DES LCx  Patient's Home Medications on Admission:  Current Outpatient Medications:    acetaminophen  (TYLENOL ) 325 MG tablet, Take 2 tablets (650 mg total) by mouth every 6 (six) hours as needed for mild pain (pain score 1-3), moderate pain (pain score 4-6), fever or headache., Disp: , Rfl:    Ascorbic Acid (VITAMIN C) 1000 MG tablet, Take 1,000 mg by mouth daily., Disp: , Rfl:    aspirin EC 81 MG tablet, Take 1 tablet (81 mg total) by mouth daily. Swallow whole., Disp: 30 tablet, Rfl: 2   atorvastatin (LIPITOR) 40 MG tablet, Take 1 tablet (40 mg total) by mouth daily., Disp: 30 tablet, Rfl: 2   carvedilol (COREG) 6.25 MG tablet, Take 1 tablet (6.25 mg total) by mouth 2 (two) times daily with a meal., Disp: 60 tablet, Rfl: 2   docusate sodium  (COLACE) 100 MG capsule, Take 1 capsule (100 mg total) by mouth daily as needed for mild constipation or moderate constipation., Disp: , Rfl:    magnesium chloride (SLOW-MAG) 64 MG TBEC SR tablet, Take 71.5 mg by mouth 2 (two) times daily. Reported on 12/17/2015 (Patient taking differently: Take 2 tablets by mouth 2 (two) times daily. Reported on 12/17/2015), Disp: , Rfl:    Multiple Vitamins-Minerals (CENTRUM SILVER) tablet, Take 1 tablet by mouth daily. , Disp: , Rfl:    nitroGLYCERIN (NITROSTAT) 0.4 MG SL tablet, Place 1 tablet (0.4 mg total) under the tongue every 5 (five)  minutes as needed for chest pain. Up to only 3 doses, Disp: 25 tablet, Rfl: 3   pyridOXINE (VITAMIN B-6) 50 MG tablet, Take 50 mg by mouth daily., Disp: , Rfl:    ticagrelor (BRILINTA) 90 MG TABS tablet, Take 1 tablet (90 mg total) by mouth 2 (two) times daily., Disp: 60 tablet, Rfl: 2  Past Medical History: Past Medical History:  Diagnosis Date   Endometrial cancer (HCC)    History of radiation therapy 12/26/15-01/29/16 and  02/13/16, 02/20/16, 02/27/16   Pelvis/ 45 Gy in 25 fractions. Vaginal cuff/ 18 Gy in 3 fractions.    Tobacco Use: Social History   Tobacco Use  Smoking Status Never  Smokeless Tobacco Never    Labs: Review Flowsheet       Latest Ref Rng & Units 05/25/2015 03/27/2024  Labs for ITP Cardiac and Pulmonary Rehab  Cholestrol 0 - 200 mg/dL 90  799   LDL (calc) 0 - 99 mg/dL 47  874   HDL-C >59 mg/dL 25  49   Trlycerides <849 mg/dL 89  870   Hemoglobin J8r 4.8 - 5.6 % 6.6  6.2   PH, Arterial 7.35 - 7.45 - 7.382   PCO2 arterial 32 - 48 mmHg - 37.9   Bicarbonate 20.0 - 28.0 mmol/L - 22.5   TCO2 22 - 32 mmol/L - 24   Acid-base deficit 0.0 - 2.0 mmol/L - 2.0   O2 Saturation % - 93     Capillary Blood Glucose: No results  found for: GLUCAP   Exercise Target Goals: Exercise Program Goal: Individual exercise prescription set using results from initial 6 min walk test and THRR while considering  patient's activity barriers and safety.   Exercise Prescription Goal: Initial exercise prescription builds to 30-45 minutes a day of aerobic activity, 2-3 days per week.  Home exercise guidelines will be given to patient during program as part of exercise prescription that the participant will acknowledge.  Activity Barriers & Risk Stratification:  Activity Barriers & Cardiac Risk Stratification - 04/17/24 1201       Activity Barriers & Cardiac Risk Stratification   Activity Barriers None    Cardiac Risk Stratification High   <5 METs on         6 Minute  Walk:  6 Minute Walk     Row Name 04/17/24 1604         6 Minute Walk   Phase Initial     Distance 1375 feet     Walk Time 6 minutes     # of Rest Breaks 0     MPH 2.6     METS 3.28     RPE 14     Perceived Dyspnea  1     VO2 Peak 11.45     Symptoms Yes (comment)     Comments SOB- resolved with rest. elevated BP, RN notifed- see notes     Resting HR 70 bpm     Resting BP 162/76     Resting Oxygen Saturation  97 %     Exercise Oxygen Saturation  during 6 min walk 97 %     Max Ex. HR 123 bpm     Max Ex. BP 202/84     2 Minute Post BP 160/72        Oxygen Initial Assessment:   Oxygen Re-Evaluation:   Oxygen Discharge (Final Oxygen Re-Evaluation):   Initial Exercise Prescription:  Initial Exercise Prescription - 04/17/24 1200       Date of Initial Exercise RX and Referring Provider   Date 04/17/24    Referring Provider [name], MD    Expected Discharge Date 07/12/24      Recumbant Bike   Level 1    RPM 50    Watts 30    Minutes 15    METs 2.2      NuStep   Level 1    SPM 75    Minutes 15    METs 2      Prescription Details   Frequency (times per week) 3    Duration Progress to 30 minutes of continuous aerobic without signs/symptoms of physical distress      Intensity   THRR 40-80% of Max Heartrate 56-113    Ratings of Perceived Exertion 11-13    Perceived Dyspnea 0-4      Progression   Progression Continue progressive overload as per policy without signs/symptoms or physical distress.      Resistance Training   Training Prescription Yes    Weight 2    Reps 10-15          Perform Capillary Blood Glucose checks as needed.  Exercise Prescription Changes:   Exercise Comments:   Exercise Goals and Review:   Exercise Goals     Row Name 04/17/24 1202             Exercise Goals   Increase Physical Activity Yes       Intervention Provide advice, education, support and  counseling about physical activity/exercise  needs.;Develop an individualized exercise prescription for aerobic and resistive training based on initial evaluation findings, risk stratification, comorbidities and participant's personal goals.       Expected Outcomes Short Term: Attend rehab on a regular basis to increase amount of physical activity.;Long Term: Exercising regularly at least 3-5 days a week.;Long Term: Add in home exercise to make exercise part of routine and to increase amount of physical activity.       Increase Strength and Stamina Yes       Intervention Provide advice, education, support and counseling about physical activity/exercise needs.;Develop an individualized exercise prescription for aerobic and resistive training based on initial evaluation findings, risk stratification, comorbidities and participant's personal goals.       Expected Outcomes Short Term: Increase workloads from initial exercise prescription for resistance, speed, and METs.;Short Term: Perform resistance training exercises routinely during rehab and add in resistance training at home;Long Term: Improve cardiorespiratory fitness, muscular endurance and strength as measured by increased METs and functional capacity ( )       Able to understand and use rate of perceived exertion (RPE) scale Yes       Intervention Provide education and explanation on how to use RPE scale       Expected Outcomes Short Term: Able to use RPE daily in rehab to express subjective intensity level;Long Term:  Able to use RPE to guide intensity level when exercising independently       Knowledge and understanding of Target Heart Rate Range (THRR) Yes       Intervention Provide education and explanation of THRR including how the numbers were predicted and where they are located for reference       Expected Outcomes Long Term: Able to use THRR to govern intensity when exercising independently;Short Term: Able to state/look up THRR;Short Term: Able to use daily as guideline for  intensity in rehab       Understanding of Exercise Prescription Yes       Intervention Provide education, explanation, and written materials on patient's individual exercise prescription       Expected Outcomes Short Term: Able to explain program exercise prescription;Long Term: Able to explain home exercise prescription to exercise independently          Exercise Goals Re-Evaluation :   Discharge Exercise Prescription (Final Exercise Prescription Changes):   Nutrition:  Target Goals: Understanding of nutrition guidelines, daily intake of sodium 1500mg , cholesterol 200mg , calories 30% from fat and 7% or less from saturated fats, daily to have 5 or more servings of fruits and vegetables.  Biometrics:  Pre Biometrics - 04/17/24 1158       Pre Biometrics   Waist Circumference 38.5 inches    Hip Circumference 40 inches    Waist to Hip Ratio 0.96 %    Triceps Skinfold 35 mm    % Body Fat 43.5 %    Grip Strength 14 kg    Flexibility 14.5 in    Single Leg Stand 8.31 seconds           Nutrition Therapy Plan and Nutrition Goals:   Nutrition Assessments:  MEDIFICTS Score Key: >=70 Need to make dietary changes  40-70 Heart Healthy Diet <= 40 Therapeutic Level Cholesterol Diet    Picture Your Plate Scores: <59 Unhealthy dietary pattern with much room for improvement. 41-50 Dietary pattern unlikely to meet recommendations for good health and room for improvement. 51-60 More healthful dietary pattern, with some room for improvement.  >  60 Healthy dietary pattern, although there may be some specific behaviors that could be improved.    Nutrition Goals Re-Evaluation:   Nutrition Goals Re-Evaluation:   Nutrition Goals Discharge (Final Nutrition Goals Re-Evaluation):   Psychosocial: Target Goals: Acknowledge presence or absence of significant depression and/or stress, maximize coping skills, provide positive support system. Participant is able to verbalize types and  ability to use techniques and skills needed for reducing stress and depression.  Initial Review & Psychosocial Screening:  Initial Psych Review & Screening - 04/17/24 1202       Initial Review   Current issues with None Identified      Family Dynamics   Good Support System? Yes   husband   Comments Teofila denies any current feelings of depression/amxiety/stress. She shared that she had some stress initially following her event, but has been coping well.      Barriers   Psychosocial barriers to participate in program There are no identifiable barriers or psychosocial needs.      Screening Interventions   Interventions Encouraged to exercise;Provide feedback about the scores to participant    Expected Outcomes Long Term goal: The participant improves quality of Life and PHQ9 Scores as seen by post scores and/or verbalization of changes;Short Term goal: Identification and review with participant of any Quality of Life or Depression concerns found by scoring the questionnaire.          Quality of Life Scores:  Quality of Life - 04/17/24 1608       Quality of Life   Select Quality of Life      Quality of Life Scores   Health/Function Pre 28.63 %    Socioeconomic Pre 30 %    Psych/Spiritual Pre 29.29 %    Family Pre 28.8 %    GLOBAL Pre 29.07 %         Scores of 19 and below usually indicate a poorer quality of life in these areas.  A difference of  2-3 points is a clinically meaningful difference.  A difference of 2-3 points in the total score of the Quality of Life Index has been associated with significant improvement in overall quality of life, self-image, physical symptoms, and general health in studies assessing change in quality of life.  PHQ-9: Review Flowsheet  More data may exist      04/17/2024 09/23/2016 04/09/2016 11/13/2015 10/09/2015  Depression screen PHQ 2/9  Decreased Interest 0 0 0 0 0  Down, Depressed, Hopeless 0 0 0 0 0  PHQ - 2 Score 0 0 0 0 0  Altered  sleeping 0 - - - -  Tired, decreased energy 0 - - - -  Change in appetite 0 - - - -  Feeling bad or failure about yourself  0 - - - -  Trouble concentrating 0 - - - -  Moving slowly or fidgety/restless 0 - - - -  Suicidal thoughts 0 - - - -  PHQ-9 Score 0 - - - -   Interpretation of Total Score  Total Score Depression Severity:  1-4 = Minimal depression, 5-9 = Mild depression, 10-14 = Moderate depression, 15-19 = Moderately severe depression, 20-27 = Severe depression   Psychosocial Evaluation and Intervention:   Psychosocial Re-Evaluation:   Psychosocial Discharge (Final Psychosocial Re-Evaluation):   Vocational Rehabilitation: Provide vocational rehab assistance to qualifying candidates.   Vocational Rehab Evaluation & Intervention:  Vocational Rehab - 04/17/24 1203       Initial Vocational Rehab Evaluation & Intervention  Assessment shows need for Vocational Rehabilitation No   retired-works part time         Education: Education Goals: Education classes will be provided on a weekly basis, covering required topics. Participant will state understanding/return demonstration of topics presented.     Core Videos: Exercise    Move It!  Clinical staff conducted group or individual video education with verbal and written material and guidebook.  Patient learns the recommended Pritikin exercise program. Exercise with the goal of living a long, healthy life. Some of the health benefits of exercise include controlled diabetes, healthier blood pressure levels, improved cholesterol levels, improved heart and lung capacity, improved sleep, and better body composition. Everyone should speak with their doctor before starting or changing an exercise routine.  Biomechanical Limitations Clinical staff conducted group or individual video education with verbal and written material and guidebook.  Patient learns how biomechanical limitations can impact exercise and how we can mitigate  and possibly overcome limitations to have an impactful and balanced exercise routine.  Body Composition Clinical staff conducted group or individual video education with verbal and written material and guidebook.  Patient learns that body composition (ratio of muscle mass to fat mass) is a key component to assessing overall fitness, rather than body weight alone. Increased fat mass, especially visceral belly fat, can put us  at increased risk for metabolic syndrome, type 2 diabetes, heart disease, and even death. It is recommended to combine diet and exercise (cardiovascular and resistance training) to improve your body composition. Seek guidance from your physician and exercise physiologist before implementing an exercise routine.  Exercise Action Plan Clinical staff conducted group or individual video education with verbal and written material and guidebook.  Patient learns the recommended strategies to achieve and enjoy long-term exercise adherence, including variety, self-motivation, self-efficacy, and positive decision making. Benefits of exercise include fitness, good health, weight management, more energy, better sleep, less stress, and overall well-being.  Medical   Heart Disease Risk Reduction Clinical staff conducted group or individual video education with verbal and written material and guidebook.  Patient learns our heart is our most vital organ as it circulates oxygen, nutrients, white blood cells, and hormones throughout the entire body, and carries waste away. Data supports a plant-based eating plan like the Pritikin Program for its effectiveness in slowing progression of and reversing heart disease. The video provides a number of recommendations to address heart disease.   Metabolic Syndrome and Belly Fat  Clinical staff conducted group or individual video education with verbal and written material and guidebook.  Patient learns what metabolic syndrome is, how it leads to heart  disease, and how one can reverse it and keep it from coming back. You have metabolic syndrome if you have 3 of the following 5 criteria: abdominal obesity, high blood pressure, high triglycerides, low HDL cholesterol, and high blood sugar.  Hypertension and Heart Disease Clinical staff conducted group or individual video education with verbal and written material and guidebook.  Patient learns that high blood pressure, or hypertension, is very common in the [place] . Hypertension is largely due to excessive salt intake, but other important risk factors include being overweight, physical inactivity, drinking too much alcohol, smoking, and not eating enough potassium from fruits and vegetables. High blood pressure is a leading risk factor for heart attack, stroke, congestive heart failure, dementia, kidney failure, and premature death. Long-term effects of excessive salt intake include stiffening of the arteries and thickening of heart muscle and organ damage. Recommendations include  ways to reduce hypertension and the risk of heart disease.  Diseases of Our Time - Focusing on Diabetes Clinical staff conducted group or individual video education with verbal and written material and guidebook.  Patient learns why the best way to stop diseases of our time is prevention, through food and other lifestyle changes. Medicine (such as prescription pills and surgeries) is often only a Band-Aid on the problem, not a long-term solution. Most common diseases of our time include obesity, type 2 diabetes, hypertension, heart disease, and cancer. The Pritikin Program is recommended and has been proven to help reduce, reverse, and/or prevent the damaging effects of metabolic syndrome.  Nutrition   Overview of the Pritikin Eating Plan  Clinical staff conducted group or individual video education with verbal and written material and guidebook.  Patient learns about the Pritikin Eating Plan for disease risk reduction.  The Pritikin Eating Plan emphasizes a wide variety of unrefined, minimally-processed carbohydrates, like fruits, vegetables, whole grains, and legumes. Go, Caution, and Stop food choices are explained. Plant-based and lean animal proteins are emphasized. Rationale provided for low sodium intake for blood pressure control, low added sugars for blood sugar stabilization, and low added fats and oils for coronary artery disease risk reduction and weight management.  Calorie Density  Clinical staff conducted group or individual video education with verbal and written material and guidebook.  Patient learns about calorie density and how it impacts the Pritikin Eating Plan. Knowing the characteristics of the food you choose will help you decide whether those foods will lead to weight gain or weight loss, and whether you want to consume more or less of them. Weight loss is usually a side effect of the Pritikin Eating Plan because of its focus on low calorie-dense foods.  Label Reading  Clinical staff conducted group or individual video education with verbal and written material and guidebook.  Patient learns about the Pritikin recommended label reading guidelines and corresponding recommendations regarding calorie density, added sugars, sodium content, and whole grains.  Dining Out - Part 1  Clinical staff conducted group or individual video education with verbal and written material and guidebook.  Patient learns that restaurant meals can be sabotaging because they can be so high in calories, fat, sodium, and/or sugar. Patient learns recommended strategies on how to positively address this and avoid unhealthy pitfalls.  Facts on Fats  Clinical staff conducted group or individual video education with verbal and written material and guidebook.  Patient learns that lifestyle modifications can be just as effective, if not more so, as many medications for lowering your risk of heart disease. A Pritikin lifestyle  can help to reduce your risk of inflammation and atherosclerosis (cholesterol build-up, or plaque, in the artery walls). Lifestyle interventions such as dietary choices and physical activity address the cause of atherosclerosis. A review of the types of fats and their impact on blood cholesterol levels, along with dietary recommendations to reduce fat intake is also included.  Nutrition Action Plan  Clinical staff conducted group or individual video education with verbal and written material and guidebook.  Patient learns how to incorporate Pritikin recommendations into their lifestyle. Recommendations include planning and keeping personal health goals in mind as an important part of their success.  Healthy Mind-Set    Healthy Minds, Bodies, Hearts  Clinical staff conducted group or individual video education with verbal and written material and guidebook.  Patient learns how to identify when they are stressed. Video will discuss the impact of that stress,  as well as the many benefits of stress management. Patient will also be introduced to stress management techniques. The way we think, act, and feel has an impact on our hearts.  How Our Thoughts Can Heal Our Hearts  Clinical staff conducted group or individual video education with verbal and written material and guidebook.  Patient learns that negative thoughts can cause depression and anxiety. This can result in negative lifestyle behavior and serious health problems. Cognitive behavioral therapy is an effective method to help control our thoughts in order to change and improve our emotional outlook.  Additional Videos:  Exercise    Improving Performance  Clinical staff conducted group or individual video education with verbal and written material and guidebook.  Patient learns to use a non-linear approach by alternating intensity levels and lengths of time spent exercising to help burn more calories and lose more body fat. Cardiovascular  exercise helps improve heart health, metabolism, hormonal balance, blood sugar control, and recovery from fatigue. Resistance training improves strength, endurance, balance, coordination, reaction time, metabolism, and muscle mass. Flexibility exercise improves circulation, posture, and balance. Seek guidance from your physician and exercise physiologist before implementing an exercise routine and learn your capabilities and proper form for all exercise.  Introduction to Yoga  Clinical staff conducted group or individual video education with verbal and written material and guidebook.  Patient learns about yoga, a discipline of the coming together of mind, breath, and body. The benefits of yoga include improved flexibility, improved range of motion, better posture and core strength, increased lung function, weight loss, and positive self-image. Yoga's heart health benefits include lowered blood pressure, healthier heart rate, decreased cholesterol and triglyceride levels, improved immune function, and reduced stress. Seek guidance from your physician and exercise physiologist before implementing an exercise routine and learn your capabilities and proper form for all exercise.  Medical   Aging: Enhancing Your Quality of Life  Clinical staff conducted group or individual video education with verbal and written material and guidebook.  Patient learns key strategies and recommendations to stay in good physical health and enhance quality of life, such as prevention strategies, having an advocate, securing a Health Care Proxy and Power of Attorney, and keeping a list of medications and system for tracking them. It also discusses how to avoid risk for bone loss.  Biology of Weight Control  Clinical staff conducted group or individual video education with verbal and written material and guidebook.  Patient learns that weight gain occurs because we consume more calories than we burn (eating more, moving less).  Even if your body weight is normal, you may have higher ratios of fat compared to muscle mass. Too much body fat puts you at increased risk for cardiovascular disease, heart attack, stroke, type 2 diabetes, and obesity-related cancers. In addition to exercise, following the Pritikin Eating Plan can help reduce your risk.  Decoding Lab Results  Clinical staff conducted group or individual video education with verbal and written material and guidebook.  Patient learns that lab test reflects one measurement whose values change over time and are influenced by many factors, including medication, stress, sleep, exercise, food, hydration, pre-existing medical conditions, and more. It is recommended to use the knowledge from this video to become more involved with your lab results and evaluate your numbers to speak with your doctor.   Diseases of Our Time - Overview  Clinical staff conducted group or individual video education with verbal and written material and guidebook.  Patient learns that according  to the CDC, 50% to 70% of chronic diseases (such as obesity, type 2 diabetes, elevated lipids, hypertension, and heart disease) are avoidable through lifestyle improvements including healthier food choices, listening to satiety cues, and increased physical activity.  Sleep Disorders Clinical staff conducted group or individual video education with verbal and written material and guidebook.  Patient learns how good quality and duration of sleep are important to overall health and well-being. Patient also learns about sleep disorders and how they impact health along with recommendations to address them, including discussing with a physician.  Nutrition  Dining Out - Part 2 Clinical staff conducted group or individual video education with verbal and written material and guidebook.  Patient learns how to plan ahead and communicate in order to maximize their dining experience in a healthy and nutritious manner.  Included are recommended food choices based on the type of restaurant the patient is visiting.   Fueling a Banker conducted group or individual video education with verbal and written material and guidebook.  There is a strong connection between our food choices and our health. Diseases like obesity and type 2 diabetes are very prevalent and are in large-part due to lifestyle choices. The Pritikin Eating Plan provides plenty of food and hunger-curbing satisfaction. It is easy to follow, affordable, and helps reduce health risks.  Menu Workshop  Clinical staff conducted group or individual video education with verbal and written material and guidebook.  Patient learns that restaurant meals can sabotage health goals because they are often packed with calories, fat, sodium, and sugar. Recommendations include strategies to plan ahead and to communicate with the manager, chef, or server to help order a healthier meal.  Planning Your Eating Strategy  Clinical staff conducted group or individual video education with verbal and written material and guidebook.  Patient learns about the Pritikin Eating Plan and its benefit of reducing the risk of disease. The Pritikin Eating Plan does not focus on calories. Instead, it emphasizes high-quality, nutrient-rich foods. By knowing the characteristics of the foods, we choose, we can determine their calorie density and make informed decisions.  Targeting Your Nutrition Priorities  Clinical staff conducted group or individual video education with verbal and written material and guidebook.  Patient learns that lifestyle habits have a tremendous impact on disease risk and progression. This video provides eating and physical activity recommendations based on your personal health goals, such as reducing LDL cholesterol, losing weight, preventing or controlling type 2 diabetes, and reducing high blood pressure.  Vitamins and Minerals  Clinical staff  conducted group or individual video education with verbal and written material and guidebook.  Patient learns different ways to obtain key vitamins and minerals, including through a recommended healthy diet. It is important to discuss all supplements you take with your doctor.   Healthy Mind-Set    Smoking Cessation  Clinical staff conducted group or individual video education with verbal and written material and guidebook.  Patient learns that cigarette smoking and tobacco addiction pose a serious health risk which affects millions of people. Stopping smoking will significantly reduce the risk of heart disease, lung disease, and many forms of cancer. Recommended strategies for quitting are covered, including working with your doctor to develop a successful plan.  Culinary   Becoming a Set Designer conducted group or individual video education with verbal and written material and guidebook.  Patient learns that cooking at home can be healthy, cost-effective, quick, and puts them in control.  Keys to cooking healthy recipes will include looking at your recipe, assessing your equipment needs, planning ahead, making it simple, choosing cost-effective seasonal ingredients, and limiting the use of added fats, salts, and sugars.  Cooking - Breakfast and Snacks  Clinical staff conducted group or individual video education with verbal and written material and guidebook.  Patient learns how important breakfast is to satiety and nutrition through the entire day. Recommendations include key foods to eat during breakfast to help stabilize blood sugar levels and to prevent overeating at meals later in the day. Planning ahead is also a key component.  Cooking - Educational Psychologist conducted group or individual video education with verbal and written material and guidebook.  Patient learns eating strategies to improve overall health, including an approach to cook more at home.  Recommendations include thinking of animal protein as a side on your plate rather than center stage and focusing instead on lower calorie dense options like vegetables, fruits, whole grains, and plant-based proteins, such as beans. Making sauces in large quantities to freeze for later and leaving the skin on your vegetables are also recommended to maximize your experience.  Cooking - Healthy Salads and Dressing Clinical staff conducted group or individual video education with verbal and written material and guidebook.  Patient learns that vegetables, fruits, whole grains, and legumes are the foundations of the Pritikin Eating Plan. Recommendations include how to incorporate each of these in flavorful and healthy salads, and how to create homemade salad dressings. Proper handling of ingredients is also covered. Cooking - Soups and [employer] - Soups and Desserts Clinical staff conducted group or individual video education with verbal and written material and guidebook.  Patient learns that Pritikin soups and desserts make for easy, nutritious, and delicious snacks and meal components that are low in sodium, fat, sugar, and calorie density, while high in vitamins, minerals, and filling fiber. Recommendations include simple and healthy ideas for soups and desserts.   Overview     The Pritikin Solution Program Overview Clinical staff conducted group or individual video education with verbal and written material and guidebook.  Patient learns that the results of the Pritikin Program have been documented in more than 100 articles published in peer-reviewed journals, and the benefits include reducing risk factors for (and, in some cases, even reversing) high cholesterol, high blood pressure, type 2 diabetes, obesity, and more! An overview of the three key pillars of the Pritikin Program will be covered: eating well, doing regular exercise, and having a healthy mind-set.  WORKSHOPS   Exercise: Exercise Basics: Building Your Action Plan Clinical staff led group instruction and group discussion with PowerPoint presentation and patient guidebook. To enhance the learning environment the use of posters, models and videos may be added. At the conclusion of this workshop, patients will comprehend the difference between physical activity and exercise, as well as the benefits of incorporating both, into their routine. Patients will understand the FITT (Frequency, Intensity, Time, and Type) principle and how to use it to build an exercise action plan. In addition, safety concerns and other considerations for exercise and cardiac rehab will be addressed by the presenter. The purpose of this lesson is to promote a comprehensive and effective weekly exercise routine in order to improve patients' overall level of fitness.   Managing Heart Disease: Your Path to a Healthier Heart Clinical staff led group instruction and group discussion with PowerPoint presentation and patient guidebook. To enhance the learning environment the use  of posters, models and videos may be added.At the conclusion of this workshop, patients will understand the anatomy and physiology of the heart. Additionally, they will understand how Pritikin's three pillars impact the risk factors, the progression, and the management of heart disease.  The purpose of this lesson is to provide a high-level overview of the heart, heart disease, and how the Pritikin lifestyle positively impacts risk factors.  Exercise Biomechanics Clinical staff led group instruction and group discussion with PowerPoint presentation and patient guidebook. To enhance the learning environment the use of posters, models and videos may be added. Patients will learn how the structural parts of their bodies function and how these functions impact their daily activities, movement, and exercise. Patients will learn how to promote a neutral spine, learn how  to manage pain, and identify ways to improve their physical movement in order to promote healthy living. The purpose of this lesson is to expose patients to common physical limitations that impact physical activity. Participants will learn practical ways to adapt and manage aches and pains, and to minimize their effect on regular exercise. Patients will learn how to maintain good posture while sitting, walking, and lifting.  Balance Training and Fall Prevention  Clinical staff led group instruction and group discussion with PowerPoint presentation and patient guidebook. To enhance the learning environment the use of posters, models and videos may be added. At the conclusion of this workshop, patients will understand the importance of their sensorimotor skills (vision, proprioception, and the vestibular system) in maintaining their ability to balance as they age. Patients will apply a variety of balancing exercises that are appropriate for their current level of function. Patients will understand the common causes for poor balance, possible solutions to these problems, and ways to modify their physical environment in order to minimize their fall risk. The purpose of this lesson is to teach patients about the importance of maintaining balance as they age and ways to minimize their risk of falling.  WORKSHOPS   Nutrition:  Fueling a Ship Broker led group instruction and group discussion with PowerPoint presentation and patient guidebook. To enhance the learning environment the use of posters, models and videos may be added. Patients will review the foundational principles of the Pritikin Eating Plan and understand what constitutes a serving size in each of the food groups. Patients will also learn Pritikin-friendly foods that are better choices when away from home and review make-ahead meal and snack options. Calorie density will be reviewed and applied to three nutrition priorities:  weight maintenance, weight loss, and weight gain. The purpose of this lesson is to reinforce (in a group setting) the key concepts around what patients are recommended to eat and how to apply these guidelines when away from home by planning and selecting Pritikin-friendly options. Patients will understand how calorie density may be adjusted for different weight management goals.  Mindful Eating  Clinical staff led group instruction and group discussion with PowerPoint presentation and patient guidebook. To enhance the learning environment the use of posters, models and videos may be added. Patients will briefly review the concepts of the Pritikin Eating Plan and the importance of low-calorie dense foods. The concept of mindful eating will be introduced as well as the importance of paying attention to internal hunger signals. Triggers for non-hunger eating and techniques for dealing with triggers will be explored. The purpose of this lesson is to provide patients with the opportunity to review the basic principles of the Pritikin Eating Plan,  discuss the value of eating mindfully and how to measure internal cues of hunger and fullness using the Hunger Scale. Patients will also discuss reasons for non-hunger eating and learn strategies to use for controlling emotional eating.  Targeting Your Nutrition Priorities Clinical staff led group instruction and group discussion with PowerPoint presentation and patient guidebook. To enhance the learning environment the use of posters, models and videos may be added. Patients will learn how to determine their genetic susceptibility to disease by reviewing their family history. Patients will gain insight into the importance of diet as part of an overall healthy lifestyle in mitigating the impact of genetics and other environmental insults. The purpose of this lesson is to provide patients with the opportunity to assess their personal nutrition priorities by looking at  their family history, their own health history and current risk factors. Patients will also be able to discuss ways of prioritizing and modifying the Pritikin Eating Plan for their highest risk areas  Menu  Clinical staff led group instruction and group discussion with PowerPoint presentation and patient guidebook. To enhance the learning environment the use of posters, models and videos may be added. Using menus brought in from [employer], or printed from [employer], patients will apply the Pritikin dining out guidelines that were presented in the [employer] video. Patients will also be able to practice these guidelines in a variety of provided scenarios. The purpose of this lesson is to provide patients with the opportunity to practice hands-on learning of the Pritikin Dining Out guidelines with actual menus and practice scenarios.  Label Reading Clinical staff led group instruction and group discussion with PowerPoint presentation and patient guidebook. To enhance the learning environment the use of posters, models and videos may be added. Patients will review and discuss the Pritikin label reading guidelines presented in Pritikin's Label Reading Educational series video. Using fool labels brought in from local grocery stores and markets, patients will apply the label reading guidelines and determine if the packaged food meet the Pritikin guidelines. The purpose of this lesson is to provide patients with the opportunity to review, discuss, and practice hands-on learning of the Pritikin Label Reading guidelines with actual packaged food labels. Cooking School  Pritikin's [employer] are designed to teach patients ways to prepare quick, simple, and affordable recipes at home. The importance of nutrition's role in chronic disease risk reduction is reflected in its emphasis in the overall Pritikin program. By learning how to prepare essential core Pritikin Eating  Plan recipes, patients will increase control over what they eat; be able to customize the flavor of foods without the use of added salt, sugar, or fat; and improve the quality of the food they consume. By learning a set of core recipes which are easily assembled, quickly prepared, and affordable, patients are more likely to prepare more healthy foods at home. These workshops focus on convenient breakfasts, simple entres, side dishes, and desserts which can be prepared with minimal effort and are consistent with nutrition recommendations for cardiovascular risk reduction. Cooking [employer] are taught by a armed forces logistics/support/administrative officer (RD) who has been trained by the [employer]. The chef or RD has a clear understanding of the importance of minimizing - if not completely eliminating - added fat, sugar, and sodium in recipes. Throughout the series of Cooking School Workshop sessions, patients will learn about healthy ingredients and efficient methods of cooking to build confidence in their capability to prepare  Cooking School weekly topics:  Adding Flavor- Sodium-Free  Fast and Healthy Breakfasts  Powerhouse Plant-Based Proteins  Satisfying Salads and Dressings  Simple Sides and Sauces  International Cuisine-Spotlight on the [employer] Zones  Delicious Desserts  Savory Soups  Efficiency Cooking - Meals in a Snap  Tasty Appetizers and Snacks  Comforting Weekend Breakfasts  One-Pot Wonders   Fast Evening Meals  Landscape Architect Your Pritikin Plate  WORKSHOPS   Healthy Mindset (Psychosocial):  Focused Goals, Sustainable Changes Clinical staff led group instruction and group discussion with PowerPoint presentation and patient guidebook. To enhance the learning environment the use of posters, models and videos may be added. Patients will be able to apply effective goal setting strategies to establish at least one personal goal, and then take consistent, meaningful  action toward that goal. They will learn to identify common barriers to achieving personal goals and develop strategies to overcome them. Patients will also gain an understanding of how our mind-set can impact our ability to achieve goals and the importance of cultivating a positive and growth-oriented mind-set. The purpose of this lesson is to provide patients with a deeper understanding of how to set and achieve personal goals, as well as the tools and strategies needed to overcome common obstacles which may arise along the way.  From Head to Heart: The Power of a Healthy Outlook  Clinical staff led group instruction and group discussion with PowerPoint presentation and patient guidebook. To enhance the learning environment the use of posters, models and videos may be added. Patients will be able to recognize and describe the impact of emotions and mood on physical health. They will discover the importance of self-care and explore self-care practices which may work for them. Patients will also learn how to utilize the 4 C's to cultivate a healthier outlook and better manage stress and challenges. The purpose of this lesson is to demonstrate to patients how a healthy outlook is an essential part of maintaining good health, especially as they continue their cardiac rehab journey.  Healthy Sleep for a Healthy Heart Clinical staff led group instruction and group discussion with PowerPoint presentation and patient guidebook. To enhance the learning environment the use of posters, models and videos may be added. At the conclusion of this workshop, patients will be able to demonstrate knowledge of the importance of sleep to overall health, well-being, and quality of life. They will understand the symptoms of, and treatments for, common sleep disorders. Patients will also be able to identify daytime and nighttime behaviors which impact sleep, and they will be able to apply these tools to help manage sleep-related  challenges. The purpose of this lesson is to provide patients with a general overview of sleep and outline the importance of quality sleep. Patients will learn about a few of the most common sleep disorders. Patients will also be introduced to the concept of "sleep hygiene," and discover ways to self-manage certain sleeping problems through simple daily behavior changes. Finally, the workshop will motivate patients by clarifying the links between quality sleep and their goals of heart-healthy living.   Recognizing and Reducing Stress Clinical staff led group instruction and group discussion with PowerPoint presentation and patient guidebook. To enhance the learning environment the use of posters, models and videos may be added. At the conclusion of this workshop, patients will be able to understand the types of stress reactions, differentiate between acute and chronic stress, and recognize the impact that chronic stress has on their health. They will  also be able to apply different coping mechanisms, such as reframing negative self-talk. Patients will have the opportunity to practice a variety of stress management techniques, such as deep abdominal breathing, progressive muscle relaxation, and/or guided imagery.  The purpose of this lesson is to educate patients on the role of stress in their lives and to provide healthy techniques for coping with it.  Learning Barriers/Preferences:  Learning Barriers/Preferences - 04/17/24 1203       Learning Barriers/Preferences   Learning Barriers Sight   glasses   Learning Preferences Audio;Computer/Internet;Group Instruction;Individual Instruction;Skilled Demonstration;Verbal Instruction;Video;Written Material;Pictoral          Education Topics:  Knowledge Questionnaire Score:  Knowledge Questionnaire Score - 04/17/24 1209       Knowledge Questionnaire Score   Pre Score 24/24          Core Components/Risk Factors/Patient Goals at Admission:   Personal Goals and Risk Factors at Admission - 04/17/24 1204       Core Components/Risk Factors/Patient Goals on Admission    Weight Management Yes    Intervention Weight Management: Develop a combined nutrition and exercise program designed to reach desired caloric intake, while maintaining appropriate intake of nutrient and fiber, sodium and fats, and appropriate energy expenditure required for the weight goal.;Weight Management: Provide education and appropriate resources to help participant work on and attain dietary goals.    Expected Outcomes Short Term: Continue to assess and modify interventions until short term weight is achieved;Long Term: Adherence to nutrition and physical activity/exercise program aimed toward attainment of established weight goal;Understanding recommendations for meals to include 15-35% energy as protein, 25-35% energy from fat, 35-60% energy from carbohydrates, less than 200mg  of dietary cholesterol, 20-35 gm of total fiber daily;Understanding of distribution of calorie intake throughout the day with the consumption of 4-5 meals/snacks    Hypertension Yes    Intervention Monitor prescription use compliance.;Provide education on lifestyle modifcations including regular physical activity/exercise, weight management, moderate sodium restriction and increased consumption of fresh fruit, vegetables, and low fat dairy, alcohol moderation, and smoking cessation.    Expected Outcomes Long Term: Maintenance of blood pressure at goal levels.;Short Term: Continued assessment and intervention until BP is < 140/50mm HG in hypertensive participants. < 130/63mm HG in hypertensive participants with diabetes, heart failure or chronic kidney disease.    Lipids Yes    Intervention Provide education and support for participant on nutrition & aerobic/resistive exercise along with prescribed medications to achieve LDL 70mg , HDL >40mg .    Expected Outcomes Long Term: Cholesterol controlled with  medications as prescribed, with individualized exercise RX and with personalized nutrition plan. Value goals: LDL < 70mg , HDL > 40 mg.;Short Term: Participant states understanding of desired cholesterol values and is compliant with medications prescribed. Participant is following exercise prescription and nutrition guidelines.          Core Components/Risk Factors/Patient Goals Review:    Core Components/Risk Factors/Patient Goals at Discharge (Final Review):    ITP Comments:  ITP Comments     Row Name 04/17/24 1159           ITP Comments Dr. Wilbert Bihari medical director. Introduction to pritikin education/intensive cardiac rehab. Initial orientation packet reviewed with patient.          Comments: Participant attended orientation for the cardiac rehabilitation program on  04/17/2024  to perform initial intake and exercise walk test. Patient introduced to the Pritikin Program education and orientation packet was reviewed. Completed 6-minute walk test, measurements, initial ITP, and exercise  prescription. Vital signs stable. Telemetry-normal sinus rhythm freq PVCs, bigmeiny, trigeminy, asymptomatic. Pt BP 162/76 pre , 202/84 post . RN Carlette notified. See note for further details.   Service time was from 1040 to 1315.  Con KATHEE Pereyra, MS, ACSM-CEP 04/17/2024 4:19 PM

## 2024-04-18 ENCOUNTER — Telehealth: Payer: Self-pay

## 2024-04-18 MED ORDER — CARVEDILOL 6.25 MG PO TABS: 6.2500 mg | ORAL_TABLET | Freq: Two times a day (BID) | ORAL | 3 refills | Status: DC

## 2024-04-18 NOTE — Telephone Encounter (Signed)
 Refill sent to pharmacy as requested    Furth, Cadence H, PA-C  Desiderio Russell SAILOR, RN Can we please send in rx for Coreg 12.5mg  BID for elevated BP. Thanks       Previous Messages    ----- Message ----- From: Bernett Saturnino NOVAK, RN Sent: 04/17/2024   1:45 PM EST To: Cadence VEAR Fishman, PA-C  FYI see note regarding BP during cardiac rehab orientation. Pt will need new prescription for the increase Coreg dosage you had previously recommended at her last follow up.  Next follow up 12/2

## 2024-04-21 ENCOUNTER — Telehealth (HOSPITAL_COMMUNITY): Payer: Self-pay

## 2024-04-21 NOTE — Telephone Encounter (Signed)
 Changed pt start date to start cardiac rehab on 11/19 instead of 11/17 do to scheduling error. Pt returned phone call and stated that was fine.

## 2024-04-24 ENCOUNTER — Encounter (HOSPITAL_COMMUNITY)

## 2024-04-26 ENCOUNTER — Telehealth: Payer: Self-pay

## 2024-04-26 ENCOUNTER — Encounter (HOSPITAL_COMMUNITY)
Admission: RE | Admit: 2024-04-26 | Discharge: 2024-04-26 | Disposition: A | Source: Ambulatory Visit | Attending: Internal Medicine

## 2024-04-26 ENCOUNTER — Other Ambulatory Visit (HOSPITAL_COMMUNITY): Payer: Self-pay

## 2024-04-26 DIAGNOSIS — I214 Non-ST elevation (NSTEMI) myocardial infarction: Secondary | ICD-10-CM

## 2024-04-26 DIAGNOSIS — Z955 Presence of coronary angioplasty implant and graft: Secondary | ICD-10-CM

## 2024-04-26 MED ORDER — CARVEDILOL 25 MG PO TABS: 25.0000 mg | ORAL_TABLET | Freq: Two times a day (BID) | ORAL | 3 refills | Status: DC

## 2024-04-26 NOTE — Telephone Encounter (Signed)
-----   Message from Cadence VEAR Fishman sent at 04/26/2024 11:11 AM EST ----- Regarding: elevated BP Patient needs coreg  25mg BID sent in for HTN. thanks

## 2024-04-26 NOTE — Progress Notes (Signed)
 Incomplete Session Note  Patient Details  Name: Cindy Anderson MRN: [id number] Date of Birth: 04-Feb-1946 Referring Provider:   Flowsheet Row INTENSIVE CARDIAC REHAB ORIENT from 04/17/2024 in [facility]  Referring Provider [name], MD    [name] did not complete her rehab session.  Initial BP 178/70 heart rate 79. Patient asymptomatic. Taking medications as prescribed. Recheck blood pressure 162/78. Will consult with onsite provider Orren Fabry [facility] before proceeding with exercise.at orientation she was supposed to increase her coreg to 12.5 mg twice a day per [name]. the medication dose was not changed she is still taking 6.25 mg twice a day. Discussed with onsite provider Orren Fabry PAC. Patient needs to increase her dose to 12.5 mg twice a day. Patient will take 2 6.25 mg twice a day until current dose runs out. Dr Ulysses office will call new dose of coreg into the patient's pharmacy [facility] on [address]. Patient said that she is upset about her medication doses and feels that there has been miscommunication. [name] said she is not sure if she will return to cardiac rehab. I had the patient talk with our onsite provider Orren Fabry [facility] to listen to her concerns. Will cancel patient's Monday appointment to give medication changes time to take effect. Patient will tentatively return to exercise on 05/03/24. Patient and husband state understanding. [name] did not exercise today due to elevated BP.Hadassah Elpidio Quan RN BSN

## 2024-04-28 ENCOUNTER — Telehealth (HOSPITAL_COMMUNITY): Payer: Self-pay

## 2024-04-28 ENCOUNTER — Telehealth: Payer: Self-pay | Admitting: Medical

## 2024-04-28 ENCOUNTER — Encounter (HOSPITAL_COMMUNITY)

## 2024-04-28 NOTE — Telephone Encounter (Signed)
 Pt husband called wanting to change pt cardiac rehab class from 8:15 to 10:15 I advised pt husband that we do not have any availability for that class or any other later classes. Pt husband understood.

## 2024-04-28 NOTE — Telephone Encounter (Signed)
 Pt c/o medication issue:  1. Name of Medication: carvedilol  (COREG ) 25 MG tablet   2. How are you currently taking this medication (dosage and times per day)? N/A  3. Are you having a reaction (difficulty breathing--STAT)? No   4. What is your medication issue? Pts husband states she should be taking 12.5 MG 2 times daily so the dosage is too high and would like a c/b.

## 2024-05-01 ENCOUNTER — Encounter (HOSPITAL_COMMUNITY)

## 2024-05-01 NOTE — Telephone Encounter (Signed)
 Called patient back and spoke with husband. Husband concerned because they were told to take 12.5 mg coreg  twice a day instead of 25 mg twice a day and he felt that was too high. Wanted to clarify with Cadence. Spoke with Cadence regarding this, last she remembered was 25 mg twice a day but since husband is concerned with the dose, they can continue with 12.5 mg twice a day and write down her vitals and send those in once a week is complete and we will go from there. Husband verbalized understanding and will do this for a week and send in.

## 2024-05-01 NOTE — Telephone Encounter (Signed)
 Pt's husband returning call that they received this morning. Please advise

## 2024-05-01 NOTE — Telephone Encounter (Signed)
 Called patient and left message for call back.

## 2024-05-03 ENCOUNTER — Encounter (HOSPITAL_COMMUNITY)

## 2024-05-03 ENCOUNTER — Telehealth (HOSPITAL_COMMUNITY): Payer: Self-pay | Admitting: *Deleted

## 2024-05-03 NOTE — Telephone Encounter (Signed)
 Left message to call cardiac rehab regarding to follow up about participating in the program.Mitchael Luckey Elpidio Quan RN BSN

## 2024-05-05 ENCOUNTER — Encounter (HOSPITAL_COMMUNITY)

## 2024-05-08 ENCOUNTER — Telehealth (HOSPITAL_COMMUNITY): Payer: Self-pay

## 2024-05-08 ENCOUNTER — Encounter (HOSPITAL_COMMUNITY): Payer: Self-pay | Admitting: *Deleted

## 2024-05-08 ENCOUNTER — Encounter (HOSPITAL_COMMUNITY): Payer: Self-pay

## 2024-05-08 DIAGNOSIS — I214 Non-ST elevation (NSTEMI) myocardial infarction: Secondary | ICD-10-CM

## 2024-05-08 DIAGNOSIS — Z955 Presence of coronary angioplasty implant and graft: Secondary | ICD-10-CM

## 2024-05-08 NOTE — Progress Notes (Signed)
 Cardiac Individual Treatment Plan  Patient Details  Name: Cindy Anderson MRN: [id number] Date of Birth: 1945/06/21 Referring Provider:   Flowsheet Row INTENSIVE CARDIAC REHAB ORIENT from 04/17/2024 in [facility]  Referring Provider [name], MD    Initial Encounter Date:  Flowsheet Row INTENSIVE CARDIAC REHAB ORIENT from 04/17/2024 in [facility]  Date 04/17/24    Visit Diagnosis: 03/27/24 NSTEMI (non-ST elevated myocardial infarction) (HCC)  03/27/24 DES LCx  Patient's Home Medications on Admission:  Current Outpatient Medications:    acetaminophen  (TYLENOL ) 325 MG tablet, Take 2 tablets (650 mg total) by mouth every 6 (six) hours as needed for mild pain (pain score 1-3), moderate pain (pain score 4-6), fever or headache., Disp: , Rfl:    Ascorbic Acid (VITAMIN C) 1000 MG tablet, Take 1,000 mg by mouth daily., Disp: , Rfl:    aspirin  EC 81 MG tablet, Take 1 tablet (81 mg total) by mouth daily. Swallow whole., Disp: 30 tablet, Rfl: 2   atorvastatin  (LIPITOR) 40 MG tablet, Take 1 tablet (40 mg total) by mouth daily., Disp: 30 tablet, Rfl: 2   carvedilol  (COREG ) 25 MG tablet, Take 1 tablet (25 mg total) by mouth 2 (two) times daily with a meal., Disp: 180 tablet, Rfl: 3   docusate sodium  (COLACE) 100 MG capsule, Take 1 capsule (100 mg total) by mouth daily as needed for mild constipation or moderate constipation., Disp: , Rfl:    magnesium chloride (SLOW-MAG) 64 MG TBEC SR tablet, Take 71.5 mg by mouth 2 (two) times daily. Reported on 12/17/2015 (Patient taking differently: Take 2 tablets by mouth 2 (two) times daily. Reported on 12/17/2015), Disp: , Rfl:    Multiple Vitamins-Minerals (CENTRUM SILVER) tablet, Take 1 tablet by mouth daily. , Disp: , Rfl:    nitroGLYCERIN  (NITROSTAT ) 0.4 MG SL tablet, Place 1 tablet (0.4 mg total) under the tongue every 5 (five) minutes  as needed for chest pain. Up to only 3 doses, Disp: 25 tablet, Rfl: 3   pyridOXINE (VITAMIN B-6) 50 MG tablet, Take 50 mg by mouth daily., Disp: , Rfl:    ticagrelor  (BRILINTA ) 90 MG TABS tablet, Take 1 tablet (90 mg total) by mouth 2 (two) times daily., Disp: 60 tablet, Rfl: 2  Past Medical History: Past Medical History:  Diagnosis Date   Endometrial cancer (HCC)    History of radiation therapy 12/26/15-01/29/16 and  02/13/16, 02/20/16, 02/27/16   Pelvis/ 45 Gy in 25 fractions. Vaginal cuff/ 18 Gy in 3 fractions.    Tobacco Use: Social History   Tobacco Use  Smoking Status Never  Smokeless Tobacco Never    Labs: Review Flowsheet       Latest Ref Rng & Units 05/25/2015 03/27/2024  Labs for ITP Cardiac and Pulmonary Rehab  Cholestrol 0 - 200 mg/dL 90  799   LDL (calc) 0 - 99 mg/dL 47  874   HDL-C >59 mg/dL 25  49   Trlycerides <849 mg/dL 89  870   Hemoglobin J8r 4.8 - 5.6 % 6.6  6.2   PH, Arterial 7.35 - 7.45 - 7.382   PCO2 arterial 32 - 48 mmHg - 37.9   Bicarbonate 20.0 - 28.0 mmol/L - 22.5   TCO2 22 - 32 mmol/L - 24   Acid-base deficit 0.0 - 2.0 mmol/L - 2.0   O2 Saturation % - 93     Capillary Blood Glucose: No results  found for: GLUCAP   Exercise Target Goals: Exercise Program Goal: Individual exercise prescription set using results from initial 6 min walk test and THRR while considering  patient's activity barriers and safety.   Exercise Prescription Goal: Initial exercise prescription builds to 30-45 minutes a day of aerobic activity, 2-3 days per week.  Home exercise guidelines will be given to patient during program as part of exercise prescription that the participant will acknowledge.  Activity Barriers & Risk Stratification:  Activity Barriers & Cardiac Risk Stratification - 04/17/24 1201       Activity Barriers & Cardiac Risk Stratification   Activity Barriers None    Cardiac Risk Stratification High   <5 METs on         6 Minute Walk:   6 Minute Walk     Row Name 04/17/24 1604         6 Minute Walk   Phase Initial     Distance 1375 feet     Walk Time 6 minutes     # of Rest Breaks 0     MPH 2.6     METS 3.28     RPE 14     Perceived Dyspnea  1     VO2 Peak 11.45     Symptoms Yes (comment)     Comments SOB- resolved with rest. elevated BP, RN notifed- see notes     Resting HR 70 bpm     Resting BP 162/76     Resting Oxygen Saturation  97 %     Exercise Oxygen Saturation  during 6 min walk 97 %     Max Ex. HR 123 bpm     Max Ex. BP 202/84     2 Minute Post BP 160/72        Oxygen Initial Assessment:   Oxygen Re-Evaluation:   Oxygen Discharge (Final Oxygen Re-Evaluation):   Initial Exercise Prescription:  Initial Exercise Prescription - 04/17/24 1200       Date of Initial Exercise RX and Referring Provider   Date 04/17/24    Referring Provider [name], MD    Expected Discharge Date 07/12/24      Recumbant Bike   Level 1    RPM 50    Watts 30    Minutes 15    METs 2.2      NuStep   Level 1    SPM 75    Minutes 15    METs 2      Prescription Details   Frequency (times per week) 3    Duration Progress to 30 minutes of continuous aerobic without signs/symptoms of physical distress      Intensity   THRR 40-80% of Max Heartrate 56-113    Ratings of Perceived Exertion 11-13    Perceived Dyspnea 0-4      Progression   Progression Continue progressive overload as per policy without signs/symptoms or physical distress.      Resistance Training   Training Prescription Yes    Weight 2    Reps 10-15          Perform Capillary Blood Glucose checks as needed.  Exercise Prescription Changes:   Exercise Comments:   Exercise Goals and Review:   Exercise Goals     Row Name 04/17/24 1202             Exercise Goals   Increase Physical Activity Yes       Intervention Provide advice, education, support and  counseling about physical activity/exercise needs.;Develop an  individualized exercise prescription for aerobic and resistive training based on initial evaluation findings, risk stratification, comorbidities and participant's personal goals.       Expected Outcomes Short Term: Attend rehab on a regular basis to increase amount of physical activity.;Long Term: Exercising regularly at least 3-5 days a week.;Long Term: Add in home exercise to make exercise part of routine and to increase amount of physical activity.       Increase Strength and Stamina Yes       Intervention Provide advice, education, support and counseling about physical activity/exercise needs.;Develop an individualized exercise prescription for aerobic and resistive training based on initial evaluation findings, risk stratification, comorbidities and participant's personal goals.       Expected Outcomes Short Term: Increase workloads from initial exercise prescription for resistance, speed, and METs.;Short Term: Perform resistance training exercises routinely during rehab and add in resistance training at home;Long Term: Improve cardiorespiratory fitness, muscular endurance and strength as measured by increased METs and functional capacity ( )       Able to understand and use rate of perceived exertion (RPE) scale Yes       Intervention Provide education and explanation on how to use RPE scale       Expected Outcomes Short Term: Able to use RPE daily in rehab to express subjective intensity level;Long Term:  Able to use RPE to guide intensity level when exercising independently       Knowledge and understanding of Target Heart Rate Range (THRR) Yes       Intervention Provide education and explanation of THRR including how the numbers were predicted and where they are located for reference       Expected Outcomes Long Term: Able to use THRR to govern intensity when exercising independently;Short Term: Able to state/look up THRR;Short Term: Able to use daily as guideline for intensity in rehab        Understanding of Exercise Prescription Yes       Intervention Provide education, explanation, and written materials on patient's individual exercise prescription       Expected Outcomes Short Term: Able to explain program exercise prescription;Long Term: Able to explain home exercise prescription to exercise independently          Exercise Goals Re-Evaluation :   Discharge Exercise Prescription (Final Exercise Prescription Changes):   Nutrition:  Target Goals: Understanding of nutrition guidelines, daily intake of sodium 1500mg , cholesterol 200mg , calories 30% from fat and 7% or less from saturated fats, daily to have 5 or more servings of fruits and vegetables.  Biometrics:  Pre Biometrics - 04/17/24 1158       Pre Biometrics   Waist Circumference 38.5 inches    Hip Circumference 40 inches    Waist to Hip Ratio 0.96 %    Triceps Skinfold 35 mm    % Body Fat 43.5 %    Grip Strength 14 kg    Flexibility 14.5 in    Single Leg Stand 8.31 seconds           Nutrition Therapy Plan and Nutrition Goals:   Nutrition Assessments:  MEDIFICTS Score Key: >=70 Need to make dietary changes  40-70 Heart Healthy Diet <= 40 Therapeutic Level Cholesterol Diet    Picture Your Plate Scores: <59 Unhealthy dietary pattern with much room for improvement. 41-50 Dietary pattern unlikely to meet recommendations for good health and room for improvement. 51-60 More healthful dietary pattern, with some room for improvement.  >  60 Healthy dietary pattern, although there may be some specific behaviors that could be improved.    Nutrition Goals Re-Evaluation:   Nutrition Goals Re-Evaluation:   Nutrition Goals Discharge (Final Nutrition Goals Re-Evaluation):   Psychosocial: Target Goals: Acknowledge presence or absence of significant depression and/or stress, maximize coping skills, provide positive support system. Participant is able to verbalize types and ability to use techniques and  skills needed for reducing stress and depression.  Initial Review & Psychosocial Screening:  Initial Psych Review & Screening - 04/17/24 1202       Initial Review   Current issues with None Identified      Family Dynamics   Good Support System? Yes   husband   Comments Tasneem denies any current feelings of depression/amxiety/stress. She shared that she had some stress initially following her event, but has been coping well.      Barriers   Psychosocial barriers to participate in program There are no identifiable barriers or psychosocial needs.      Screening Interventions   Interventions Encouraged to exercise;Provide feedback about the scores to participant    Expected Outcomes Long Term goal: The participant improves quality of Life and PHQ9 Scores as seen by post scores and/or verbalization of changes;Short Term goal: Identification and review with participant of any Quality of Life or Depression concerns found by scoring the questionnaire.          Quality of Life Scores:  Quality of Life - 04/17/24 1608       Quality of Life   Select Quality of Life      Quality of Life Scores   Health/Function Pre 28.63 %    Socioeconomic Pre 30 %    Psych/Spiritual Pre 29.29 %    Family Pre 28.8 %    GLOBAL Pre 29.07 %         Scores of 19 and below usually indicate a poorer quality of life in these areas.  A difference of  2-3 points is a clinically meaningful difference.  A difference of 2-3 points in the total score of the Quality of Life Index has been associated with significant improvement in overall quality of life, self-image, physical symptoms, and general health in studies assessing change in quality of life.  PHQ-9: Review Flowsheet  More data may exist      04/17/2024 09/23/2016 04/09/2016 11/13/2015 10/09/2015  Depression screen PHQ 2/9  Decreased Interest 0 0 0 0 0  Down, Depressed, Hopeless 0 0 0 0 0  PHQ - 2 Score 0 0 0 0 0  Altered sleeping 0 - - - -  Tired,  decreased energy 0 - - - -  Change in appetite 0 - - - -  Feeling bad or failure about yourself  0 - - - -  Trouble concentrating 0 - - - -  Moving slowly or fidgety/restless 0 - - - -  Suicidal thoughts 0 - - - -  PHQ-9 Score 0 - - - -   Interpretation of Total Score  Total Score Depression Severity:  1-4 = Minimal depression, 5-9 = Mild depression, 10-14 = Moderate depression, 15-19 = Moderately severe depression, 20-27 = Severe depression   Psychosocial Evaluation and Intervention:   Psychosocial Re-Evaluation:   Psychosocial Discharge (Final Psychosocial Re-Evaluation):   Vocational Rehabilitation: Provide vocational rehab assistance to qualifying candidates.   Vocational Rehab Evaluation & Intervention:  Vocational Rehab - 04/17/24 1203       Initial Vocational Rehab Evaluation & Intervention  Assessment shows need for Vocational Rehabilitation No   retired-works part time         Education: Education Goals: Education classes will be provided on a weekly basis, covering required topics. Participant will state understanding/return demonstration of topics presented.    Education     Row Name 04/26/24 0900     Education   Cardiac Education Topics Pritikin   Secondary School Teacher School   Educator Dietitian   Weekly Topic Efficiency Cooking - Meals in a Snap   Instruction Review Code 1- Verbalizes Understanding   Class Start Time 0815   Class Stop Time 0904   Class Time Calculation (min) 49 min      Core Videos: Exercise    Move It!  Clinical staff conducted group or individual video education with verbal and written material and guidebook.  Patient learns the recommended Pritikin exercise program. Exercise with the goal of living a long, healthy life. Some of the health benefits of exercise include controlled diabetes, healthier blood pressure levels, improved cholesterol levels, improved heart and lung capacity, improved sleep, and better  body composition. Everyone should speak with their doctor before starting or changing an exercise routine.  Biomechanical Limitations Clinical staff conducted group or individual video education with verbal and written material and guidebook.  Patient learns how biomechanical limitations can impact exercise and how we can mitigate and possibly overcome limitations to have an impactful and balanced exercise routine.  Body Composition Clinical staff conducted group or individual video education with verbal and written material and guidebook.  Patient learns that body composition (ratio of muscle mass to fat mass) is a key component to assessing overall fitness, rather than body weight alone. Increased fat mass, especially visceral belly fat, can put us  at increased risk for metabolic syndrome, type 2 diabetes, heart disease, and even death. It is recommended to combine diet and exercise (cardiovascular and resistance training) to improve your body composition. Seek guidance from your physician and exercise physiologist before implementing an exercise routine.  Exercise Action Plan Clinical staff conducted group or individual video education with verbal and written material and guidebook.  Patient learns the recommended strategies to achieve and enjoy long-term exercise adherence, including variety, self-motivation, self-efficacy, and positive decision making. Benefits of exercise include fitness, good health, weight management, more energy, better sleep, less stress, and overall well-being.  Medical   Heart Disease Risk Reduction Clinical staff conducted group or individual video education with verbal and written material and guidebook.  Patient learns our heart is our most vital organ as it circulates oxygen, nutrients, white blood cells, and hormones throughout the entire body, and carries waste away. Data supports a plant-based eating plan like the Pritikin Program for its effectiveness in slowing  progression of and reversing heart disease. The video provides a number of recommendations to address heart disease.   Metabolic Syndrome and Belly Fat  Clinical staff conducted group or individual video education with verbal and written material and guidebook.  Patient learns what metabolic syndrome is, how it leads to heart disease, and how one can reverse it and keep it from coming back. You have metabolic syndrome if you have 3 of the following 5 criteria: abdominal obesity, high blood pressure, high triglycerides, low HDL cholesterol, and high blood sugar.  Hypertension and Heart Disease Clinical staff conducted group or individual video education with verbal and written material and guidebook.  Patient learns that high blood pressure, or hypertension, is very common  in the [place] . Hypertension is largely due to excessive salt intake, but other important risk factors include being overweight, physical inactivity, drinking too much alcohol, smoking, and not eating enough potassium from fruits and vegetables. High blood pressure is a leading risk factor for heart attack, stroke, congestive heart failure, dementia, kidney failure, and premature death. Long-term effects of excessive salt intake include stiffening of the arteries and thickening of heart muscle and organ damage. Recommendations include ways to reduce hypertension and the risk of heart disease.  Diseases of Our Time - Focusing on Diabetes Clinical staff conducted group or individual video education with verbal and written material and guidebook.  Patient learns why the best way to stop diseases of our time is prevention, through food and other lifestyle changes. Medicine (such as prescription pills and surgeries) is often only a Band-Aid on the problem, not a long-term solution. Most common diseases of our time include obesity, type 2 diabetes, hypertension, heart disease, and cancer. The Pritikin Program is recommended and has been  proven to help reduce, reverse, and/or prevent the damaging effects of metabolic syndrome.  Nutrition   Overview of the Pritikin Eating Plan  Clinical staff conducted group or individual video education with verbal and written material and guidebook.  Patient learns about the Pritikin Eating Plan for disease risk reduction. The Pritikin Eating Plan emphasizes a wide variety of unrefined, minimally-processed carbohydrates, like fruits, vegetables, whole grains, and legumes. Go, Caution, and Stop food choices are explained. Plant-based and lean animal proteins are emphasized. Rationale provided for low sodium intake for blood pressure control, low added sugars for blood sugar stabilization, and low added fats and oils for coronary artery disease risk reduction and weight management.  Calorie Density  Clinical staff conducted group or individual video education with verbal and written material and guidebook.  Patient learns about calorie density and how it impacts the Pritikin Eating Plan. Knowing the characteristics of the food you choose will help you decide whether those foods will lead to weight gain or weight loss, and whether you want to consume more or less of them. Weight loss is usually a side effect of the Pritikin Eating Plan because of its focus on low calorie-dense foods.  Label Reading  Clinical staff conducted group or individual video education with verbal and written material and guidebook.  Patient learns about the Pritikin recommended label reading guidelines and corresponding recommendations regarding calorie density, added sugars, sodium content, and whole grains.  Dining Out - Part 1  Clinical staff conducted group or individual video education with verbal and written material and guidebook.  Patient learns that restaurant meals can be sabotaging because they can be so high in calories, fat, sodium, and/or sugar. Patient learns recommended strategies on how to positively address  this and avoid unhealthy pitfalls.  Facts on Fats  Clinical staff conducted group or individual video education with verbal and written material and guidebook.  Patient learns that lifestyle modifications can be just as effective, if not more so, as many medications for lowering your risk of heart disease. A Pritikin lifestyle can help to reduce your risk of inflammation and atherosclerosis (cholesterol build-up, or plaque, in the artery walls). Lifestyle interventions such as dietary choices and physical activity address the cause of atherosclerosis. A review of the types of fats and their impact on blood cholesterol levels, along with dietary recommendations to reduce fat intake is also included.  Nutrition Action Plan  Clinical staff conducted group or individual video education with  verbal and written material and guidebook.  Patient learns how to incorporate Pritikin recommendations into their lifestyle. Recommendations include planning and keeping personal health goals in mind as an important part of their success.  Healthy Mind-Set    Healthy Minds, Bodies, Hearts  Clinical staff conducted group or individual video education with verbal and written material and guidebook.  Patient learns how to identify when they are stressed. Video will discuss the impact of that stress, as well as the many benefits of stress management. Patient will also be introduced to stress management techniques. The way we think, act, and feel has an impact on our hearts.  How Our Thoughts Can Heal Our Hearts  Clinical staff conducted group or individual video education with verbal and written material and guidebook.  Patient learns that negative thoughts can cause depression and anxiety. This can result in negative lifestyle behavior and serious health problems. Cognitive behavioral therapy is an effective method to help control our thoughts in order to change and improve our emotional outlook.  Additional  Videos:  Exercise    Improving Performance  Clinical staff conducted group or individual video education with verbal and written material and guidebook.  Patient learns to use a non-linear approach by alternating intensity levels and lengths of time spent exercising to help burn more calories and lose more body fat. Cardiovascular exercise helps improve heart health, metabolism, hormonal balance, blood sugar control, and recovery from fatigue. Resistance training improves strength, endurance, balance, coordination, reaction time, metabolism, and muscle mass. Flexibility exercise improves circulation, posture, and balance. Seek guidance from your physician and exercise physiologist before implementing an exercise routine and learn your capabilities and proper form for all exercise.  Introduction to Yoga  Clinical staff conducted group or individual video education with verbal and written material and guidebook.  Patient learns about yoga, a discipline of the coming together of mind, breath, and body. The benefits of yoga include improved flexibility, improved range of motion, better posture and core strength, increased lung function, weight loss, and positive self-image. Yoga's heart health benefits include lowered blood pressure, healthier heart rate, decreased cholesterol and triglyceride levels, improved immune function, and reduced stress. Seek guidance from your physician and exercise physiologist before implementing an exercise routine and learn your capabilities and proper form for all exercise.  Medical   Aging: Enhancing Your Quality of Life  Clinical staff conducted group or individual video education with verbal and written material and guidebook.  Patient learns key strategies and recommendations to stay in good physical health and enhance quality of life, such as prevention strategies, having an advocate, securing a Health Care Proxy and Power of Attorney, and keeping a list of medications  and system for tracking them. It also discusses how to avoid risk for bone loss.  Biology of Weight Control  Clinical staff conducted group or individual video education with verbal and written material and guidebook.  Patient learns that weight gain occurs because we consume more calories than we burn (eating more, moving less). Even if your body weight is normal, you may have higher ratios of fat compared to muscle mass. Too much body fat puts you at increased risk for cardiovascular disease, heart attack, stroke, type 2 diabetes, and obesity-related cancers. In addition to exercise, following the Pritikin Eating Plan can help reduce your risk.  Decoding Lab Results  Clinical staff conducted group or individual video education with verbal and written material and guidebook.  Patient learns that lab test reflects one measurement whose  values change over time and are influenced by many factors, including medication, stress, sleep, exercise, food, hydration, pre-existing medical conditions, and more. It is recommended to use the knowledge from this video to become more involved with your lab results and evaluate your numbers to speak with your doctor.   Diseases of Our Time - Overview  Clinical staff conducted group or individual video education with verbal and written material and guidebook.  Patient learns that according to the CDC, 50% to 70% of chronic diseases (such as obesity, type 2 diabetes, elevated lipids, hypertension, and heart disease) are avoidable through lifestyle improvements including healthier food choices, listening to satiety cues, and increased physical activity.  Sleep Disorders Clinical staff conducted group or individual video education with verbal and written material and guidebook.  Patient learns how good quality and duration of sleep are important to overall health and well-being. Patient also learns about sleep disorders and how they impact health along with  recommendations to address them, including discussing with a physician.  Nutrition  Dining Out - Part 2 Clinical staff conducted group or individual video education with verbal and written material and guidebook.  Patient learns how to plan ahead and communicate in order to maximize their dining experience in a healthy and nutritious manner. Included are recommended food choices based on the type of restaurant the patient is visiting.   Fueling a Banker conducted group or individual video education with verbal and written material and guidebook.  There is a strong connection between our food choices and our health. Diseases like obesity and type 2 diabetes are very prevalent and are in large-part due to lifestyle choices. The Pritikin Eating Plan provides plenty of food and hunger-curbing satisfaction. It is easy to follow, affordable, and helps reduce health risks.  Menu Workshop  Clinical staff conducted group or individual video education with verbal and written material and guidebook.  Patient learns that restaurant meals can sabotage health goals because they are often packed with calories, fat, sodium, and sugar. Recommendations include strategies to plan ahead and to communicate with the manager, chef, or server to help order a healthier meal.  Planning Your Eating Strategy  Clinical staff conducted group or individual video education with verbal and written material and guidebook.  Patient learns about the Pritikin Eating Plan and its benefit of reducing the risk of disease. The Pritikin Eating Plan does not focus on calories. Instead, it emphasizes high-quality, nutrient-rich foods. By knowing the characteristics of the foods, we choose, we can determine their calorie density and make informed decisions.  Targeting Your Nutrition Priorities  Clinical staff conducted group or individual video education with verbal and written material and guidebook.  Patient learns  that lifestyle habits have a tremendous impact on disease risk and progression. This video provides eating and physical activity recommendations based on your personal health goals, such as reducing LDL cholesterol, losing weight, preventing or controlling type 2 diabetes, and reducing high blood pressure.  Vitamins and Minerals  Clinical staff conducted group or individual video education with verbal and written material and guidebook.  Patient learns different ways to obtain key vitamins and minerals, including through a recommended healthy diet. It is important to discuss all supplements you take with your doctor.   Healthy Mind-Set    Smoking Cessation  Clinical staff conducted group or individual video education with verbal and written material and guidebook.  Patient learns that cigarette smoking and tobacco addiction pose a serious health risk which  affects millions of people. Stopping smoking will significantly reduce the risk of heart disease, lung disease, and many forms of cancer. Recommended strategies for quitting are covered, including working with your doctor to develop a successful plan.  Culinary   Becoming a Set Designer conducted group or individual video education with verbal and written material and guidebook.  Patient learns that cooking at home can be healthy, cost-effective, quick, and puts them in control. Keys to cooking healthy recipes will include looking at your recipe, assessing your equipment needs, planning ahead, making it simple, choosing cost-effective seasonal ingredients, and limiting the use of added fats, salts, and sugars.  Cooking - Breakfast and Snacks  Clinical staff conducted group or individual video education with verbal and written material and guidebook.  Patient learns how important breakfast is to satiety and nutrition through the entire day. Recommendations include key foods to eat during breakfast to help stabilize blood sugar  levels and to prevent overeating at meals later in the day. Planning ahead is also a key component.  Cooking - Educational Psychologist conducted group or individual video education with verbal and written material and guidebook.  Patient learns eating strategies to improve overall health, including an approach to cook more at home. Recommendations include thinking of animal protein as a side on your plate rather than center stage and focusing instead on lower calorie dense options like vegetables, fruits, whole grains, and plant-based proteins, such as beans. Making sauces in large quantities to freeze for later and leaving the skin on your vegetables are also recommended to maximize your experience.  Cooking - Healthy Salads and Dressing Clinical staff conducted group or individual video education with verbal and written material and guidebook.  Patient learns that vegetables, fruits, whole grains, and legumes are the foundations of the Pritikin Eating Plan. Recommendations include how to incorporate each of these in flavorful and healthy salads, and how to create homemade salad dressings. Proper handling of ingredients is also covered. Cooking - Soups and [employer] - Soups and Desserts Clinical staff conducted group or individual video education with verbal and written material and guidebook.  Patient learns that Pritikin soups and desserts make for easy, nutritious, and delicious snacks and meal components that are low in sodium, fat, sugar, and calorie density, while high in vitamins, minerals, and filling fiber. Recommendations include simple and healthy ideas for soups and desserts.   Overview     The Pritikin Solution Program Overview Clinical staff conducted group or individual video education with verbal and written material and guidebook.  Patient learns that the results of the Pritikin Program have been documented in more than 100 articles published in peer-reviewed  journals, and the benefits include reducing risk factors for (and, in some cases, even reversing) high cholesterol, high blood pressure, type 2 diabetes, obesity, and more! An overview of the three key pillars of the Pritikin Program will be covered: eating well, doing regular exercise, and having a healthy mind-set.  WORKSHOPS  Exercise: Exercise Basics: Building Your Action Plan Clinical staff led group instruction and group discussion with PowerPoint presentation and patient guidebook. To enhance the learning environment the use of posters, models and videos may be added. At the conclusion of this workshop, patients will comprehend the difference between physical activity and exercise, as well as the benefits of incorporating both, into their routine. Patients will understand the FITT (Frequency, Intensity, Time, and Type) principle and how to use it to build  an exercise action plan. In addition, safety concerns and other considerations for exercise and cardiac rehab will be addressed by the presenter. The purpose of this lesson is to promote a comprehensive and effective weekly exercise routine in order to improve patients' overall level of fitness.   Managing Heart Disease: Your Path to a Healthier Heart Clinical staff led group instruction and group discussion with PowerPoint presentation and patient guidebook. To enhance the learning environment the use of posters, models and videos may be added.At the conclusion of this workshop, patients will understand the anatomy and physiology of the heart. Additionally, they will understand how Pritikin's three pillars impact the risk factors, the progression, and the management of heart disease.  The purpose of this lesson is to provide a high-level overview of the heart, heart disease, and how the Pritikin lifestyle positively impacts risk factors.  Exercise Biomechanics Clinical staff led group instruction and group discussion with PowerPoint  presentation and patient guidebook. To enhance the learning environment the use of posters, models and videos may be added. Patients will learn how the structural parts of their bodies function and how these functions impact their daily activities, movement, and exercise. Patients will learn how to promote a neutral spine, learn how to manage pain, and identify ways to improve their physical movement in order to promote healthy living. The purpose of this lesson is to expose patients to common physical limitations that impact physical activity. Participants will learn practical ways to adapt and manage aches and pains, and to minimize their effect on regular exercise. Patients will learn how to maintain good posture while sitting, walking, and lifting.  Balance Training and Fall Prevention  Clinical staff led group instruction and group discussion with PowerPoint presentation and patient guidebook. To enhance the learning environment the use of posters, models and videos may be added. At the conclusion of this workshop, patients will understand the importance of their sensorimotor skills (vision, proprioception, and the vestibular system) in maintaining their ability to balance as they age. Patients will apply a variety of balancing exercises that are appropriate for their current level of function. Patients will understand the common causes for poor balance, possible solutions to these problems, and ways to modify their physical environment in order to minimize their fall risk. The purpose of this lesson is to teach patients about the importance of maintaining balance as they age and ways to minimize their risk of falling.  WORKSHOPS   Nutrition:  Fueling a Ship Broker led group instruction and group discussion with PowerPoint presentation and patient guidebook. To enhance the learning environment the use of posters, models and videos may be added. Patients will review the  foundational principles of the Pritikin Eating Plan and understand what constitutes a serving size in each of the food groups. Patients will also learn Pritikin-friendly foods that are better choices when away from home and review make-ahead meal and snack options. Calorie density will be reviewed and applied to three nutrition priorities: weight maintenance, weight loss, and weight gain. The purpose of this lesson is to reinforce (in a group setting) the key concepts around what patients are recommended to eat and how to apply these guidelines when away from home by planning and selecting Pritikin-friendly options. Patients will understand how calorie density may be adjusted for different weight management goals.  Mindful Eating  Clinical staff led group instruction and group discussion with PowerPoint presentation and patient guidebook. To enhance the learning environment the use of posters, models  and videos may be added. Patients will briefly review the concepts of the Pritikin Eating Plan and the importance of low-calorie dense foods. The concept of mindful eating will be introduced as well as the importance of paying attention to internal hunger signals. Triggers for non-hunger eating and techniques for dealing with triggers will be explored. The purpose of this lesson is to provide patients with the opportunity to review the basic principles of the Pritikin Eating Plan, discuss the value of eating mindfully and how to measure internal cues of hunger and fullness using the Hunger Scale. Patients will also discuss reasons for non-hunger eating and learn strategies to use for controlling emotional eating.  Targeting Your Nutrition Priorities Clinical staff led group instruction and group discussion with PowerPoint presentation and patient guidebook. To enhance the learning environment the use of posters, models and videos may be added. Patients will learn how to determine their genetic susceptibility to  disease by reviewing their family history. Patients will gain insight into the importance of diet as part of an overall healthy lifestyle in mitigating the impact of genetics and other environmental insults. The purpose of this lesson is to provide patients with the opportunity to assess their personal nutrition priorities by looking at their family history, their own health history and current risk factors. Patients will also be able to discuss ways of prioritizing and modifying the Pritikin Eating Plan for their highest risk areas  Menu  Clinical staff led group instruction and group discussion with PowerPoint presentation and patient guidebook. To enhance the learning environment the use of posters, models and videos may be added. Using menus brought in from [employer], or printed from [employer], patients will apply the Pritikin dining out guidelines that were presented in the [employer] video. Patients will also be able to practice these guidelines in a variety of provided scenarios. The purpose of this lesson is to provide patients with the opportunity to practice hands-on learning of the Pritikin Dining Out guidelines with actual menus and practice scenarios.  Label Reading Clinical staff led group instruction and group discussion with PowerPoint presentation and patient guidebook. To enhance the learning environment the use of posters, models and videos may be added. Patients will review and discuss the Pritikin label reading guidelines presented in Pritikin's Label Reading Educational series video. Using fool labels brought in from local grocery stores and markets, patients will apply the label reading guidelines and determine if the packaged food meet the Pritikin guidelines. The purpose of this lesson is to provide patients with the opportunity to review, discuss, and practice hands-on learning of the Pritikin Label Reading guidelines with actual packaged food  labels. Cooking School  Pritikin's [employer] are designed to teach patients ways to prepare quick, simple, and affordable recipes at home. The importance of nutrition's role in chronic disease risk reduction is reflected in its emphasis in the overall Pritikin program. By learning how to prepare essential core Pritikin Eating Plan recipes, patients will increase control over what they eat; be able to customize the flavor of foods without the use of added salt, sugar, or fat; and improve the quality of the food they consume. By learning a set of core recipes which are easily assembled, quickly prepared, and affordable, patients are more likely to prepare more healthy foods at home. These workshops focus on convenient breakfasts, simple entres, side dishes, and desserts which can be prepared with minimal effort and are consistent with nutrition recommendations for cardiovascular risk  reduction. Cooking [employer] are taught by a armed forces logistics/support/administrative officer (RD) who has been trained by the [employer]. The chef or RD has a clear understanding of the importance of minimizing - if not completely eliminating - added fat, sugar, and sodium in recipes. Throughout the series of Cooking School Workshop sessions, patients will learn about healthy ingredients and efficient methods of cooking to build confidence in their capability to prepare    Cooking School weekly topics:  Adding Flavor- Sodium-Free  Fast and Healthy Breakfasts  Powerhouse Plant-Based Proteins  Satisfying Salads and Dressings  Simple Sides and Sauces  International Cuisine-Spotlight on the [employer] Zones  Delicious Desserts  Savory Soups  [employer] - Meals in a Astronomer Appetizers and Snacks  Comforting Weekend Breakfasts  One-Pot Wonders   Fast Evening Meals  Landscape Architect Your Pritikin Plate  WORKSHOPS   Healthy Mindset (Psychosocial):  Focused Goals, Sustainable  Changes Clinical staff led group instruction and group discussion with PowerPoint presentation and patient guidebook. To enhance the learning environment the use of posters, models and videos may be added. Patients will be able to apply effective goal setting strategies to establish at least one personal goal, and then take consistent, meaningful action toward that goal. They will learn to identify common barriers to achieving personal goals and develop strategies to overcome them. Patients will also gain an understanding of how our mind-set can impact our ability to achieve goals and the importance of cultivating a positive and growth-oriented mind-set. The purpose of this lesson is to provide patients with a deeper understanding of how to set and achieve personal goals, as well as the tools and strategies needed to overcome common obstacles which may arise along the way.  From Head to Heart: The Power of a Healthy Outlook  Clinical staff led group instruction and group discussion with PowerPoint presentation and patient guidebook. To enhance the learning environment the use of posters, models and videos may be added. Patients will be able to recognize and describe the impact of emotions and mood on physical health. They will discover the importance of self-care and explore self-care practices which may work for them. Patients will also learn how to utilize the 4 C's to cultivate a healthier outlook and better manage stress and challenges. The purpose of this lesson is to demonstrate to patients how a healthy outlook is an essential part of maintaining good health, especially as they continue their cardiac rehab journey.  Healthy Sleep for a Healthy Heart Clinical staff led group instruction and group discussion with PowerPoint presentation and patient guidebook. To enhance the learning environment the use of posters, models and videos may be added. At the conclusion of this workshop, patients will be able  to demonstrate knowledge of the importance of sleep to overall health, well-being, and quality of life. They will understand the symptoms of, and treatments for, common sleep disorders. Patients will also be able to identify daytime and nighttime behaviors which impact sleep, and they will be able to apply these tools to help manage sleep-related challenges. The purpose of this lesson is to provide patients with a general overview of sleep and outline the importance of quality sleep. Patients will learn about a few of the most common sleep disorders. Patients will also be introduced to the concept of "sleep hygiene," and discover ways to self-manage certain sleeping problems through simple daily behavior changes. Finally, the workshop will motivate patients by clarifying the links between  quality sleep and their goals of heart-healthy living.   Recognizing and Reducing Stress Clinical staff led group instruction and group discussion with PowerPoint presentation and patient guidebook. To enhance the learning environment the use of posters, models and videos may be added. At the conclusion of this workshop, patients will be able to understand the types of stress reactions, differentiate between acute and chronic stress, and recognize the impact that chronic stress has on their health. They will also be able to apply different coping mechanisms, such as reframing negative self-talk. Patients will have the opportunity to practice a variety of stress management techniques, such as deep abdominal breathing, progressive muscle relaxation, and/or guided imagery.  The purpose of this lesson is to educate patients on the role of stress in their lives and to provide healthy techniques for coping with it.  Learning Barriers/Preferences:  Learning Barriers/Preferences - 04/17/24 1203       Learning Barriers/Preferences   Learning Barriers Sight   glasses   Learning Preferences Audio;Computer/Internet;Group  Instruction;Individual Instruction;Skilled Demonstration;Verbal Instruction;Video;Written Material;Pictoral          Education Topics:  Knowledge Questionnaire Score:  Knowledge Questionnaire Score - 04/17/24 1209       Knowledge Questionnaire Score   Pre Score 24/24          Core Components/Risk Factors/Patient Goals at Admission:  Personal Goals and Risk Factors at Admission - 04/17/24 1204       Core Components/Risk Factors/Patient Goals on Admission    Weight Management Yes    Intervention Weight Management: Develop a combined nutrition and exercise program designed to reach desired caloric intake, while maintaining appropriate intake of nutrient and fiber, sodium and fats, and appropriate energy expenditure required for the weight goal.;Weight Management: Provide education and appropriate resources to help participant work on and attain dietary goals.    Expected Outcomes Short Term: Continue to assess and modify interventions until short term weight is achieved;Long Term: Adherence to nutrition and physical activity/exercise program aimed toward attainment of established weight goal;Understanding recommendations for meals to include 15-35% energy as protein, 25-35% energy from fat, 35-60% energy from carbohydrates, less than 200mg  of dietary cholesterol, 20-35 gm of total fiber daily;Understanding of distribution of calorie intake throughout the day with the consumption of 4-5 meals/snacks    Hypertension Yes    Intervention Monitor prescription use compliance.;Provide education on lifestyle modifcations including regular physical activity/exercise, weight management, moderate sodium restriction and increased consumption of fresh fruit, vegetables, and low fat dairy, alcohol moderation, and smoking cessation.    Expected Outcomes Long Term: Maintenance of blood pressure at goal levels.;Short Term: Continued assessment and intervention until BP is < 140/50mm HG in hypertensive  participants. < 130/80mm HG in hypertensive participants with diabetes, heart failure or chronic kidney disease.    Lipids Yes    Intervention Provide education and support for participant on nutrition & aerobic/resistive exercise along with prescribed medications to achieve LDL 70mg , HDL >40mg .    Expected Outcomes Long Term: Cholesterol controlled with medications as prescribed, with individualized exercise RX and with personalized nutrition plan. Value goals: LDL < 70mg , HDL > 40 mg.;Short Term: Participant states understanding of desired cholesterol values and is compliant with medications prescribed. Participant is following exercise prescription and nutrition guidelines.          Core Components/Risk Factors/Patient Goals Review:    Core Components/Risk Factors/Patient Goals at Discharge (Final Review):    ITP Comments:  ITP Comments     Row Name 04/17/24 1159 05/08/24  1426         ITP Comments Dr. Wilbert Bihari medical director. Introduction to pritikin education/intensive cardiac rehab. Initial orientation packet reviewed with patient. 30 Day ITP Review. Patient will start cardiac rehab in January 2026 want to participate in the 1015 class.         Comments: See ITP comments. Patient is not starting exercise until Jan 2026.Hadassah Elpidio Quan RN BSN

## 2024-05-08 NOTE — Telephone Encounter (Signed)
 Pt called to canceled her remaining cardiac rehab, pt stated that she is unable to do the 8:15 class time. Pt would like to finish up her cardiac rehab at a different class time preferably 10:15 class time when it becomes available.

## 2024-05-09 ENCOUNTER — Ambulatory Visit: Attending: Medical | Admitting: Medical

## 2024-05-09 ENCOUNTER — Encounter: Payer: Self-pay | Admitting: Medical

## 2024-05-09 VITALS — BP 193/106 | HR 91 | Ht 62.0 in | Wt 151.4 lb

## 2024-05-09 DIAGNOSIS — E782 Mixed hyperlipidemia: Secondary | ICD-10-CM | POA: Insufficient documentation

## 2024-05-09 DIAGNOSIS — I2510 Atherosclerotic heart disease of native coronary artery without angina pectoris: Secondary | ICD-10-CM | POA: Diagnosis present

## 2024-05-09 DIAGNOSIS — R011 Cardiac murmur, unspecified: Secondary | ICD-10-CM | POA: Diagnosis present

## 2024-05-09 DIAGNOSIS — I214 Non-ST elevation (NSTEMI) myocardial infarction: Secondary | ICD-10-CM | POA: Diagnosis present

## 2024-05-09 DIAGNOSIS — I10 Essential (primary) hypertension: Secondary | ICD-10-CM | POA: Diagnosis present

## 2024-05-09 DIAGNOSIS — Z79899 Other long term (current) drug therapy: Secondary | ICD-10-CM | POA: Insufficient documentation

## 2024-05-09 MED ORDER — CARVEDILOL 25 MG PO TABS: 25.0000 mg | ORAL_TABLET | Freq: Two times a day (BID) | ORAL | 3 refills | Status: AC

## 2024-05-09 NOTE — Patient Instructions (Signed)
 Medication Instructions:  Your physician recommends the following medication changes.  INCREASE: Carvedilol  to 25 mg by mouth twice a day   *If you need a refill on your cardiac medications before your next appointment, please call your pharmacy*  Lab Work: Your provider would like for you to have following labs drawn today Lipid, direct LDL.     Testing/Procedures: No test ordered today   Follow-Up: At [facility] you and your health needs are our priority.  As part of our continuing mission to provide you with exceptional heart care, our providers are all part of one team.  This team includes your primary Cardiologist (physician) and Advanced Practice Providers or APPs (Physician Assistants and Nurse Practitioners) who all work together to provide you with the care you need, when you need it.  Your next appointment:   1 month(s)  Provider:   Mikey Fishman, PA-C

## 2024-05-09 NOTE — Progress Notes (Unsigned)
 Cardiology Office Note   Date:  05/10/2024  ID:  Cindy Anderson, DOB [birth year], MRN [id number] PCP: Pcp, No   HeartCare Providers Cardiologist:  None  History of Present Illness Cindy Anderson is a 78 y.o. female with a h/o endometrial cancer (in remission since 2020)s/p hysterectomy, CAD who presents for follow-up of CAD.    Patient was admitted in late October 2025 with non-STEMI.  High-sensitivity troponin peaked at 788.  She underwent cardiac cath revealing high-grade mid to distal dominant circumflex disease which was stented x 1.  She had residual moderate nonflow-limiting disease.  LV function was essentially normal by ventriculography.  She was started on DAPT with aspirin  and Brilinta  x 12 months.  The patient was last seen 04/07/2024 and was overall doing well.  Blood pressures were mildly elevated but she did not want to make any changes.  She was referred to cardiac rehab in [place].  Since that time, blood pressure has been elevated and Coreg  was increased.  Today, the patient reports BP was better for a couple days, then it went into the 140s. She is overall feeling OK. She does not feel lightheaded or dizzy. No chest pain or shortness of breath. She stopped doing cardiac rehab because that time was too early for her, but she plans to go back in January at a later time. She is walking at home and lifting light weights.   Studies Reviewed      Cardiac catheterization/PCI and stent (03/27/2024)   Conclusion   Conclusions: Severe single-vessel coronary artery disease with 95% stenosis of distal LCx (dominant vessel) consistent with acute plaque rupture.  There is also moderate, approaching severe disease involving the LAD and proximal/mid LCx. Preserved left ventricular systolic function with subtle basal inferior hypokinesis (LVEF 55-65%).  Mildly elevated left ventricular filling pressure (LVEDP 19 mmHg). Successful PCI to distal LCx using Synergy XD 2.25 x 12 mm  drug-eluting stent with 0% residual stenosis and TIMI-3 flow.   Recommendations: Dual antiplatelet therapy with aspirin  and ticagrelor  for at least 12 months. Aggressive secondary prevention of coronary artery disease. Favor medical therapy for residual LAD and LCx disease. Follow-up echocardiogram.   [name], MD [facility] Coronary Diagrams   Diagnostic Dominance: Left  Intervention          Echo 03/2024 1. Left ventricular ejection fraction, by estimation, is 60 to 65%. The  left ventricle has normal function. The left ventricle has no regional  wall motion abnormalities. Left ventricular diastolic parameters were  normal. There is moderate hypokinesis of   the left ventricular, basal-mid inferior wall. The average left  ventricular global longitudinal strain is -20.8 %. The global longitudinal  strain is normal.   2. Right ventricular systolic function is normal. The right ventricular  size is normal. Tricuspid regurgitation signal is inadequate for assessing  PA pressure.   3. The mitral valve is normal in structure. Mild mitral valve  regurgitation. No evidence of mitral stenosis.   4. The aortic valve is tricuspid. There is mild calcification of the  aortic valve. Aortic valve regurgitation is not visualized. Aortic valve  sclerosis/calcification is present, without any evidence of aortic  stenosis.   5. The inferior vena cava is normal in size with greater than 50%  respiratory variability, suggesting right atrial pressure of 3 mmHg.   Physical Exam VS:  BP (!) 193/106   Pulse 91   Ht 5' 2 (1.575 m)   Wt 151 lb 6.4 oz (68.7 kg)  SpO2 96%   BMI 27.69 kg/m        Wt Readings from Last 3 Encounters:  05/09/24 151 lb 6.4 oz (68.7 kg)  04/17/24 153 lb 10.6 oz (69.7 kg)  04/07/24 151 lb 4 oz (68.6 kg)    GEN: Well nourished, well developed in no acute distress NECK: No JVD; No carotid bruits CARDIAC: RRR, no murmurs, rubs, gallops RESPIRATORY:   Clear to auscultation without rales, wheezing or rhonchi  ABDOMEN: Soft, non-tender, non-distended EXTREMITIES:  No edema; No deformity   ASSESSMENT AND PLAN  NSTEMI /CAD s/p PCI/DES x 1 Lcx 03/2024 Patient denis anginal symptoms. She started cardiac rehab, but was stopped on different visits due to high blood pressures. Coreg  was subsequently increased. She also felt time of CR was too early, and she plans on restarting cardiac rehab in January at a later time. She is walking and lifting light weights at home. Continue ASA, Brilnta, Lipitor, Coreg  and SL NTG.  HTN BP is severely elevated here today, and have been elevated at cardiac rehab as well. Bps at home have been 120-140s/50s. Coreg  increased to 12.5mg  BID. I will further increase this to 25mg  BID.   HLD LDL 125. Re-check lipids today. Continue Lipitor 40mg  daily. She reports muscle pain when she walks. We will continue statin for and may consider statin Holiday in the future and pending on lab results.  Murmur Echo showed normal LVEF, mild MR and aortic valve sclerosis/calcification       Dispo: Follow-up in 1 month  Signed, Natalio Salois VEAR Fishman, PA-C

## 2024-05-10 ENCOUNTER — Encounter (HOSPITAL_COMMUNITY): Payer: Self-pay

## 2024-05-10 ENCOUNTER — Other Ambulatory Visit (HOSPITAL_COMMUNITY): Payer: Self-pay

## 2024-05-10 ENCOUNTER — Ambulatory Visit: Payer: Self-pay | Admitting: Medical

## 2024-05-10 LAB — LDL CHOLESTEROL, DIRECT: LDL Direct: 68 mg/dL (ref 0–99)

## 2024-05-10 LAB — LIPID PANEL
Chol/HDL Ratio: 3 ratio (ref 0.0–4.4)
Cholesterol, Total: 136 mg/dL (ref 100–199)
HDL: 46 mg/dL (ref >39)
LDL Chol Calc (NIH): 66 mg/dL (ref 0–99)
Triglycerides: 138 mg/dL (ref 0–149)
VLDL Cholesterol Cal: 24 mg/dL (ref 5–40)

## 2024-05-12 ENCOUNTER — Encounter (HOSPITAL_COMMUNITY): Payer: Self-pay

## 2024-05-15 ENCOUNTER — Encounter (HOSPITAL_COMMUNITY): Payer: Self-pay

## 2024-05-17 ENCOUNTER — Encounter (HOSPITAL_COMMUNITY): Payer: Self-pay

## 2024-05-19 ENCOUNTER — Encounter (HOSPITAL_COMMUNITY): Payer: Self-pay

## 2024-05-22 ENCOUNTER — Encounter (HOSPITAL_COMMUNITY): Payer: Self-pay

## 2024-05-24 ENCOUNTER — Encounter (HOSPITAL_COMMUNITY): Payer: Self-pay

## 2024-05-26 ENCOUNTER — Encounter (HOSPITAL_COMMUNITY): Payer: Self-pay

## 2024-05-29 ENCOUNTER — Encounter (HOSPITAL_COMMUNITY): Payer: Self-pay

## 2024-05-31 ENCOUNTER — Encounter (HOSPITAL_COMMUNITY): Payer: Self-pay

## 2024-06-02 ENCOUNTER — Encounter (HOSPITAL_COMMUNITY): Payer: Self-pay

## 2024-06-05 ENCOUNTER — Encounter (HOSPITAL_COMMUNITY): Payer: Self-pay

## 2024-06-07 ENCOUNTER — Encounter (HOSPITAL_COMMUNITY): Payer: Self-pay

## 2024-06-07 ENCOUNTER — Encounter (HOSPITAL_COMMUNITY): Payer: Self-pay | Admitting: *Deleted

## 2024-06-07 ENCOUNTER — Telehealth (HOSPITAL_COMMUNITY): Payer: Self-pay | Admitting: *Deleted

## 2024-06-07 DIAGNOSIS — I214 Non-ST elevation (NSTEMI) myocardial infarction: Secondary | ICD-10-CM

## 2024-06-07 DIAGNOSIS — Z955 Presence of coronary angioplasty implant and graft: Secondary | ICD-10-CM

## 2024-06-07 NOTE — Progress Notes (Signed)
 Cardiac Individual Treatment Plan  Patient Details  Name: Cindy Anderson MRN: [id number] Date of Birth: 15-May-1946 Referring Provider:   Flowsheet Row INTENSIVE CARDIAC REHAB ORIENT from 04/17/2024 in [facility]  Referring Provider [name], MD    Initial Encounter Date:  Flowsheet Row INTENSIVE CARDIAC REHAB ORIENT from 04/17/2024 in [facility]  Date 04/17/24    Visit Diagnosis: 03/27/24 NSTEMI (non-ST elevated myocardial infarction) (HCC)  03/27/24 DES LCx  Patient's Home Medications on Admission: Current Medications[1]  Past Medical History: Past Medical History:  Diagnosis Date   Endometrial cancer (HCC)    History of radiation therapy 12/26/15-01/29/16 and  02/13/16, 02/20/16, 02/27/16   Pelvis/ 45 Gy in 25 fractions. Vaginal cuff/ 18 Gy in 3 fractions.    Tobacco Use: Tobacco Use History[2]  Labs: Review Flowsheet       Latest Ref Rng & Units 05/25/2015 03/27/2024 05/09/2024  Labs for ITP Cardiac and Pulmonary Rehab  Cholestrol 100 - 199 mg/dL 90  799  863   LDL (calc) 0 - 99 mg/dL 47  874  66   Direct LDL 0 - 99 mg/dL - - 68   HDL-C >60 mg/dL 25  49  46   Trlycerides 0 - 149 mg/dL 89  870  861   Hemoglobin A1c 4.8 - 5.6 % 6.6  6.2  -  PH, Arterial 7.35 - 7.45 - 7.382  -  PCO2 arterial 32 - 48 mmHg - 37.9  -  Bicarbonate 20.0 - 28.0 mmol/L - 22.5  -  TCO2 22 - 32 mmol/L - 24  -  Acid-base deficit 0.0 - 2.0 mmol/L - 2.0  -  O2 Saturation % - 93  -    Capillary Blood Glucose: No results found for: GLUCAP   Exercise Target Goals: Exercise Program Goal: Individual exercise prescription set using results from initial 6 min walk test and THRR while considering  patients activity barriers and safety.   Exercise Prescription Goal: Initial exercise prescription builds to 30-45 minutes a day of aerobic activity, 2-3 days per week.   Home exercise guidelines will be given to patient during program as part of exercise prescription that the participant will acknowledge.  Activity Barriers & Risk Stratification:  Activity Barriers & Cardiac Risk Stratification - 04/17/24 1201       Activity Barriers & Cardiac Risk Stratification   Activity Barriers None    Cardiac Risk Stratification High   <5 METs on         6 Minute Walk:  6 Minute Walk     Row Name 04/17/24 1604         6 Minute Walk   Phase Initial     Distance 1375 feet     Walk Time 6 minutes     # of Rest Breaks 0     MPH 2.6     METS 3.28     RPE 14     Perceived Dyspnea  1     VO2 Peak 11.45     Symptoms Yes (comment)     Comments SOB- resolved with rest. elevated BP, RN notifed- see notes     Resting HR 70 bpm     Resting BP 162/76     Resting Oxygen Saturation  97 %     Exercise Oxygen Saturation  during 6 min walk 97 %     Max Ex.  HR 123 bpm     Max Ex. BP 202/84     2 Minute Post BP 160/72        Oxygen Initial Assessment:   Oxygen Re-Evaluation:   Oxygen Discharge (Final Oxygen Re-Evaluation):   Initial Exercise Prescription:  Initial Exercise Prescription - 04/17/24 1200       Date of Initial Exercise RX and Referring Provider   Date 04/17/24    Referring Provider [name], MD    Expected Discharge Date 07/12/24      Recumbant Bike   Level 1    RPM 50    Watts 30    Minutes 15    METs 2.2      NuStep   Level 1    SPM 75    Minutes 15    METs 2      Prescription Details   Frequency (times per week) 3    Duration Progress to 30 minutes of continuous aerobic without signs/symptoms of physical distress      Intensity   THRR 40-80% of Max Heartrate 56-113    Ratings of Perceived Exertion 11-13    Perceived Dyspnea 0-4      Progression   Progression Continue progressive overload as per policy without signs/symptoms or physical distress.      Resistance Training   Training Prescription Yes     Weight 2    Reps 10-15          Perform Capillary Blood Glucose checks as needed.  Exercise Prescription Changes:   Exercise Comments:   Exercise Goals and Review:   Exercise Goals     Row Name 04/17/24 1202             Exercise Goals   Increase Physical Activity Yes       Intervention Provide advice, education, support and counseling about physical activity/exercise needs.;Develop an individualized exercise prescription for aerobic and resistive training based on initial evaluation findings, risk stratification, comorbidities and participant's personal goals.       Expected Outcomes Short Term: Attend rehab on a regular basis to increase amount of physical activity.;Long Term: Exercising regularly at least 3-5 days a week.;Long Term: Add in home exercise to make exercise part of routine and to increase amount of physical activity.       Increase Strength and Stamina Yes       Intervention Provide advice, education, support and counseling about physical activity/exercise needs.;Develop an individualized exercise prescription for aerobic and resistive training based on initial evaluation findings, risk stratification, comorbidities and participant's personal goals.       Expected Outcomes Short Term: Increase workloads from initial exercise prescription for resistance, speed, and METs.;Short Term: Perform resistance training exercises routinely during rehab and add in resistance training at home;Long Term: Improve cardiorespiratory fitness, muscular endurance and strength as measured by increased METs and functional capacity ( )       Able to understand and use rate of perceived exertion (RPE) scale Yes       Intervention Provide education and explanation on how to use RPE scale       Expected Outcomes Short Term: Able to use RPE daily in rehab to express subjective intensity level;Long Term:  Able to use RPE to guide intensity level when exercising independently       Knowledge  and understanding of Target Heart Rate Range (THRR) Yes       Intervention Provide education and explanation of THRR including how the numbers were predicted  and where they are located for reference       Expected Outcomes Long Term: Able to use THRR to govern intensity when exercising independently;Short Term: Able to state/look up THRR;Short Term: Able to use daily as guideline for intensity in rehab       Understanding of Exercise Prescription Yes       Intervention Provide education, explanation, and written materials on patient's individual exercise prescription       Expected Outcomes Short Term: Able to explain program exercise prescription;Long Term: Able to explain home exercise prescription to exercise independently          Exercise Goals Re-Evaluation :   Discharge Exercise Prescription (Final Exercise Prescription Changes):   Nutrition:  Target Goals: Understanding of nutrition guidelines, daily intake of sodium 1500mg , cholesterol 200mg , calories 30% from fat and 7% or less from saturated fats, daily to have 5 or more servings of fruits and vegetables.  Biometrics:  Pre Biometrics - 04/17/24 1158       Pre Biometrics   Waist Circumference 38.5 inches    Hip Circumference 40 inches    Waist to Hip Ratio 0.96 %    Triceps Skinfold 35 mm    % Body Fat 43.5 %    Grip Strength 14 kg    Flexibility 14.5 in    Single Leg Stand 8.31 seconds           Nutrition Therapy Plan and Nutrition Goals:   Nutrition Assessments:  MEDIFICTS Score Key: >=70 Need to make dietary changes  40-70 Heart Healthy Diet <= 40 Therapeutic Level Cholesterol Diet    Picture Your Plate Scores: <59 Unhealthy dietary pattern with much room for improvement. 41-50 Dietary pattern unlikely to meet recommendations for good health and room for improvement. 51-60 More healthful dietary pattern, with some room for improvement.  >60 Healthy dietary pattern, although there may be some specific  behaviors that could be improved.    Nutrition Goals Re-Evaluation:   Nutrition Goals Re-Evaluation:   Nutrition Goals Discharge (Final Nutrition Goals Re-Evaluation):   Psychosocial: Target Goals: Acknowledge presence or absence of significant depression and/or stress, maximize coping skills, provide positive support system. Participant is able to verbalize types and ability to use techniques and skills needed for reducing stress and depression.  Initial Review & Psychosocial Screening:  Initial Psych Review & Screening - 04/17/24 1202       Initial Review   Current issues with None Identified      Family Dynamics   Good Support System? Yes   husband   Comments Cindy denies any current feelings of depression/amxiety/stress. She shared that she had some stress initially following her event, but has been coping well.      Barriers   Psychosocial barriers to participate in program There are no identifiable barriers or psychosocial needs.      Screening Interventions   Interventions Encouraged to exercise;Provide feedback about the scores to participant    Expected Outcomes Long Term goal: The participant improves quality of Life and PHQ9 Scores as seen by post scores and/or verbalization of changes;Short Term goal: Identification and review with participant of any Quality of Life or Depression concerns found by scoring the questionnaire.          Quality of Life Scores:  Quality of Life - 04/17/24 1608       Quality of Life   Select Quality of Life      Quality of Life Scores   Health/Function Pre 28.63 %  Socioeconomic Pre 30 %    Psych/Spiritual Pre 29.29 %    Family Pre 28.8 %    GLOBAL Pre 29.07 %         Scores of 19 and below usually indicate a poorer quality of life in these areas.  A difference of  2-3 points is a clinically meaningful difference.  A difference of 2-3 points in the total score of the Quality of Life Index has been associated with  significant improvement in overall quality of life, self-image, physical symptoms, and general health in studies assessing change in quality of life.  PHQ-9: Review Flowsheet  More data may exist      04/17/2024 09/23/2016 04/09/2016 11/13/2015 10/09/2015  Depression screen PHQ 2/9  Decreased Interest 0 0 0 0 0  Down, Depressed, Hopeless 0 0 0 0 0  PHQ - 2 Score 0 0 0 0 0  Altered sleeping 0 - - - -  Tired, decreased energy 0 - - - -  Change in appetite 0 - - - -  Feeling bad or failure about yourself  0 - - - -  Trouble concentrating 0 - - - -  Moving slowly or fidgety/restless 0 - - - -  Suicidal thoughts 0 - - - -  PHQ-9 Score 0 - - - -   Interpretation of Total Score  Total Score Depression Severity:  1-4 = Minimal depression, 5-9 = Mild depression, 10-14 = Moderate depression, 15-19 = Moderately severe depression, 20-27 = Severe depression   Psychosocial Evaluation and Intervention:   Psychosocial Re-Evaluation:   Psychosocial Discharge (Final Psychosocial Re-Evaluation):   Vocational Rehabilitation: Provide vocational rehab assistance to qualifying candidates.   Vocational Rehab Evaluation & Intervention:  Vocational Rehab - 04/17/24 1203       Initial Vocational Rehab Evaluation & Intervention   Assessment shows need for Vocational Rehabilitation No   retired-works part time         Education: Education Goals: Education classes will be provided on a weekly basis, covering required topics. Participant will state understanding/return demonstration of topics presented.    Education     Row Name 04/26/24 0900     Education   Cardiac Education Topics Pritikin   Secondary School Teacher School   Educator Dietitian   Weekly Topic Efficiency Cooking - Meals in a Snap   Instruction Review Code 1- Verbalizes Understanding   Class Start Time 0815   Class Stop Time 0904   Class Time Calculation (min) 49 min      Core Videos: Exercise    Move It!   Clinical staff conducted group or individual video education with verbal and written material and guidebook.  Patient learns the recommended Pritikin exercise program. Exercise with the goal of living a long, healthy life. Some of the health benefits of exercise include controlled diabetes, healthier blood pressure levels, improved cholesterol levels, improved heart and lung capacity, improved sleep, and better body composition. Everyone should speak with their doctor before starting or changing an exercise routine.  Biomechanical Limitations Clinical staff conducted group or individual video education with verbal and written material and guidebook.  Patient learns how biomechanical limitations can impact exercise and how we can mitigate and possibly overcome limitations to have an impactful and balanced exercise routine.  Body Composition Clinical staff conducted group or individual video education with verbal and written material and guidebook.  Patient learns that body composition (ratio of muscle mass to fat mass) is a  key component to assessing overall fitness, rather than body weight alone. Increased fat mass, especially visceral belly fat, can put us  at increased risk for metabolic syndrome, type 2 diabetes, heart disease, and even death. It is recommended to combine diet and exercise (cardiovascular and resistance training) to improve your body composition. Seek guidance from your physician and exercise physiologist before implementing an exercise routine.  Exercise Action Plan Clinical staff conducted group or individual video education with verbal and written material and guidebook.  Patient learns the recommended strategies to achieve and enjoy long-term exercise adherence, including variety, self-motivation, self-efficacy, and positive decision making. Benefits of exercise include fitness, good health, weight management, more energy, better sleep, less stress, and overall  well-being.  Medical   Heart Disease Risk Reduction Clinical staff conducted group or individual video education with verbal and written material and guidebook.  Patient learns our heart is our most vital organ as it circulates oxygen, nutrients, white blood cells, and hormones throughout the entire body, and carries waste away. Data supports a plant-based eating plan like the Pritikin Program for its effectiveness in slowing progression of and reversing heart disease. The video provides a number of recommendations to address heart disease.   Metabolic Syndrome and Belly Fat  Clinical staff conducted group or individual video education with verbal and written material and guidebook.  Patient learns what metabolic syndrome is, how it leads to heart disease, and how one can reverse it and keep it from coming back. You have metabolic syndrome if you have 3 of the following 5 criteria: abdominal obesity, high blood pressure, high triglycerides, low HDL cholesterol, and high blood sugar.  Hypertension and Heart Disease Clinical staff conducted group or individual video education with verbal and written material and guidebook.  Patient learns that high blood pressure, or hypertension, is very common in the [place] . Hypertension is largely due to excessive salt intake, but other important risk factors include being overweight, physical inactivity, drinking too much alcohol, smoking, and not eating enough potassium from fruits and vegetables. High blood pressure is a leading risk factor for heart attack, stroke, congestive heart failure, dementia, kidney failure, and premature death. Long-term effects of excessive salt intake include stiffening of the arteries and thickening of heart muscle and organ damage. Recommendations include ways to reduce hypertension and the risk of heart disease.  Diseases of Our Time - Focusing on Diabetes Clinical staff conducted group or individual video education with  verbal and written material and guidebook.  Patient learns why the best way to stop diseases of our time is prevention, through food and other lifestyle changes. Medicine (such as prescription pills and surgeries) is often only a Band-Aid on the problem, not a long-term solution. Most common diseases of our time include obesity, type 2 diabetes, hypertension, heart disease, and cancer. The Pritikin Program is recommended and has been proven to help reduce, reverse, and/or prevent the damaging effects of metabolic syndrome.  Nutrition   Overview of the Pritikin Eating Plan  Clinical staff conducted group or individual video education with verbal and written material and guidebook.  Patient learns about the Pritikin Eating Plan for disease risk reduction. The Pritikin Eating Plan emphasizes a wide variety of unrefined, minimally-processed carbohydrates, like fruits, vegetables, whole grains, and legumes. Go, Caution, and Stop food choices are explained. Plant-based and lean animal proteins are emphasized. Rationale provided for low sodium intake for blood pressure control, low added sugars for blood sugar stabilization, and low added fats and oils for  coronary artery disease risk reduction and weight management.  Calorie Density  Clinical staff conducted group or individual video education with verbal and written material and guidebook.  Patient learns about calorie density and how it impacts the Pritikin Eating Plan. Knowing the characteristics of the food you choose will help you decide whether those foods will lead to weight gain or weight loss, and whether you want to consume more or less of them. Weight loss is usually a side effect of the Pritikin Eating Plan because of its focus on low calorie-dense foods.  Label Reading  Clinical staff conducted group or individual video education with verbal and written material and guidebook.  Patient learns about the Pritikin recommended label reading  guidelines and corresponding recommendations regarding calorie density, added sugars, sodium content, and whole grains.  Dining Out - Part 1  Clinical staff conducted group or individual video education with verbal and written material and guidebook.  Patient learns that restaurant meals can be sabotaging because they can be so high in calories, fat, sodium, and/or sugar. Patient learns recommended strategies on how to positively address this and avoid unhealthy pitfalls.  Facts on Fats  Clinical staff conducted group or individual video education with verbal and written material and guidebook.  Patient learns that lifestyle modifications can be just as effective, if not more so, as many medications for lowering your risk of heart disease. A Pritikin lifestyle can help to reduce your risk of inflammation and atherosclerosis (cholesterol build-up, or plaque, in the artery walls). Lifestyle interventions such as dietary choices and physical activity address the cause of atherosclerosis. A review of the types of fats and their impact on blood cholesterol levels, along with dietary recommendations to reduce fat intake is also included.  Nutrition Action Plan  Clinical staff conducted group or individual video education with verbal and written material and guidebook.  Patient learns how to incorporate Pritikin recommendations into their lifestyle. Recommendations include planning and keeping personal health goals in mind as an important part of their success.  Healthy Mind-Set    Healthy Minds, Bodies, Hearts  Clinical staff conducted group or individual video education with verbal and written material and guidebook.  Patient learns how to identify when they are stressed. Video will discuss the impact of that stress, as well as the many benefits of stress management. Patient will also be introduced to stress management techniques. The way we think, act, and feel has an impact on our hearts.  How Our  Thoughts Can Heal Our Hearts  Clinical staff conducted group or individual video education with verbal and written material and guidebook.  Patient learns that negative thoughts can cause depression and anxiety. This can result in negative lifestyle behavior and serious health problems. Cognitive behavioral therapy is an effective method to help control our thoughts in order to change and improve our emotional outlook.  Additional Videos:  Exercise    Improving Performance  Clinical staff conducted group or individual video education with verbal and written material and guidebook.  Patient learns to use a non-linear approach by alternating intensity levels and lengths of time spent exercising to help burn more calories and lose more body fat. Cardiovascular exercise helps improve heart health, metabolism, hormonal balance, blood sugar control, and recovery from fatigue. Resistance training improves strength, endurance, balance, coordination, reaction time, metabolism, and muscle mass. Flexibility exercise improves circulation, posture, and balance. Seek guidance from your physician and exercise physiologist before implementing an exercise routine and learn your capabilities and proper  form for all exercise.  Introduction to Yoga  Clinical staff conducted group or individual video education with verbal and written material and guidebook.  Patient learns about yoga, a discipline of the coming together of mind, breath, and body. The benefits of yoga include improved flexibility, improved range of motion, better posture and core strength, increased lung function, weight loss, and positive self-image. Yogas heart health benefits include lowered blood pressure, healthier heart rate, decreased cholesterol and triglyceride levels, improved immune function, and reduced stress. Seek guidance from your physician and exercise physiologist before implementing an exercise routine and learn your capabilities and  proper form for all exercise.  Medical   Aging: Enhancing Your Quality of Life  Clinical staff conducted group or individual video education with verbal and written material and guidebook.  Patient learns key strategies and recommendations to stay in good physical health and enhance quality of life, such as prevention strategies, having an advocate, securing a Health Care Proxy and Power of Attorney, and keeping a list of medications and system for tracking them. It also discusses how to avoid risk for bone loss.  Biology of Weight Control  Clinical staff conducted group or individual video education with verbal and written material and guidebook.  Patient learns that weight gain occurs because we consume more calories than we burn (eating more, moving less). Even if your body weight is normal, you may have higher ratios of fat compared to muscle mass. Too much body fat puts you at increased risk for cardiovascular disease, heart attack, stroke, type 2 diabetes, and obesity-related cancers. In addition to exercise, following the Pritikin Eating Plan can help reduce your risk.  Decoding Lab Results  Clinical staff conducted group or individual video education with verbal and written material and guidebook.  Patient learns that lab test reflects one measurement whose values change over time and are influenced by many factors, including medication, stress, sleep, exercise, food, hydration, pre-existing medical conditions, and more. It is recommended to use the knowledge from this video to become more involved with your lab results and evaluate your numbers to speak with your doctor.   Diseases of Our Time - Overview  Clinical staff conducted group or individual video education with verbal and written material and guidebook.  Patient learns that according to the CDC, 50% to 70% of chronic diseases (such as obesity, type 2 diabetes, elevated lipids, hypertension, and heart disease) are avoidable through  lifestyle improvements including healthier food choices, listening to satiety cues, and increased physical activity.  Sleep Disorders Clinical staff conducted group or individual video education with verbal and written material and guidebook.  Patient learns how good quality and duration of sleep are important to overall health and well-being. Patient also learns about sleep disorders and how they impact health along with recommendations to address them, including discussing with a physician.  Nutrition  Dining Out - Part 2 Clinical staff conducted group or individual video education with verbal and written material and guidebook.  Patient learns how to plan ahead and communicate in order to maximize their dining experience in a healthy and nutritious manner. Included are recommended food choices based on the type of restaurant the patient is visiting.   Fueling a Banker conducted group or individual video education with verbal and written material and guidebook.  There is a strong connection between our food choices and our health. Diseases like obesity and type 2 diabetes are very prevalent and are in large-part due to lifestyle  choices. The Pritikin Eating Plan provides plenty of food and hunger-curbing satisfaction. It is easy to follow, affordable, and helps reduce health risks.  Menu Workshop  Clinical staff conducted group or individual video education with verbal and written material and guidebook.  Patient learns that restaurant meals can sabotage health goals because they are often packed with calories, fat, sodium, and sugar. Recommendations include strategies to plan ahead and to communicate with the manager, chef, or server to help order a healthier meal.  Planning Your Eating Strategy  Clinical staff conducted group or individual video education with verbal and written material and guidebook.  Patient learns about the Pritikin Eating Plan and its benefit of  reducing the risk of disease. The Pritikin Eating Plan does not focus on calories. Instead, it emphasizes high-quality, nutrient-rich foods. By knowing the characteristics of the foods, we choose, we can determine their calorie density and make informed decisions.  Targeting Your Nutrition Priorities  Clinical staff conducted group or individual video education with verbal and written material and guidebook.  Patient learns that lifestyle habits have a tremendous impact on disease risk and progression. This video provides eating and physical activity recommendations based on your personal health goals, such as reducing LDL cholesterol, losing weight, preventing or controlling type 2 diabetes, and reducing high blood pressure.  Vitamins and Minerals  Clinical staff conducted group or individual video education with verbal and written material and guidebook.  Patient learns different ways to obtain key vitamins and minerals, including through a recommended healthy diet. It is important to discuss all supplements you take with your doctor.   Healthy Mind-Set    Smoking Cessation  Clinical staff conducted group or individual video education with verbal and written material and guidebook.  Patient learns that cigarette smoking and tobacco addiction pose a serious health risk which affects millions of people. Stopping smoking will significantly reduce the risk of heart disease, lung disease, and many forms of cancer. Recommended strategies for quitting are covered, including working with your doctor to develop a successful plan.  Culinary   Becoming a Set Designer conducted group or individual video education with verbal and written material and guidebook.  Patient learns that cooking at home can be healthy, cost-effective, quick, and puts them in control. Keys to cooking healthy recipes will include looking at your recipe, assessing your equipment needs, planning ahead, making it  simple, choosing cost-effective seasonal ingredients, and limiting the use of added fats, salts, and sugars.  Cooking - Breakfast and Snacks  Clinical staff conducted group or individual video education with verbal and written material and guidebook.  Patient learns how important breakfast is to satiety and nutrition through the entire day. Recommendations include key foods to eat during breakfast to help stabilize blood sugar levels and to prevent overeating at meals later in the day. Planning ahead is also a key component.  Cooking - Educational Psychologist conducted group or individual video education with verbal and written material and guidebook.  Patient learns eating strategies to improve overall health, including an approach to cook more at home. Recommendations include thinking of animal protein as a side on your plate rather than center stage and focusing instead on lower calorie dense options like vegetables, fruits, whole grains, and plant-based proteins, such as beans. Making sauces in large quantities to freeze for later and leaving the skin on your vegetables are also recommended to maximize your experience.  Cooking - Healthy Salads and Dressing Clinical  staff conducted group or individual video education with verbal and written material and guidebook.  Patient learns that vegetables, fruits, whole grains, and legumes are the foundations of the Pritikin Eating Plan. Recommendations include how to incorporate each of these in flavorful and healthy salads, and how to create homemade salad dressings. Proper handling of ingredients is also covered. Cooking - Soups and [employer] - Soups and Desserts Clinical staff conducted group or individual video education with verbal and written material and guidebook.  Patient learns that Pritikin soups and desserts make for easy, nutritious, and delicious snacks and meal components that are low in sodium, fat, sugar, and calorie  density, while high in vitamins, minerals, and filling fiber. Recommendations include simple and healthy ideas for soups and desserts.   Overview     The Pritikin Solution Program Overview Clinical staff conducted group or individual video education with verbal and written material and guidebook.  Patient learns that the results of the Pritikin Program have been documented in more than 100 articles published in peer-reviewed journals, and the benefits include reducing risk factors for (and, in some cases, even reversing) high cholesterol, high blood pressure, type 2 diabetes, obesity, and more! An overview of the three key pillars of the Pritikin Program will be covered: eating well, doing regular exercise, and having a healthy mind-set.  WORKSHOPS  Exercise: Exercise Basics: Building Your Action Plan Clinical staff led group instruction and group discussion with PowerPoint presentation and patient guidebook. To enhance the learning environment the use of posters, models and videos may be added. At the conclusion of this workshop, patients will comprehend the difference between physical activity and exercise, as well as the benefits of incorporating both, into their routine. Patients will understand the FITT (Frequency, Intensity, Time, and Type) principle and how to use it to build an exercise action plan. In addition, safety concerns and other considerations for exercise and cardiac rehab will be addressed by the presenter. The purpose of this lesson is to promote a comprehensive and effective weekly exercise routine in order to improve patients overall level of fitness.   Managing Heart Disease: Your Path to a Healthier Heart Clinical staff led group instruction and group discussion with PowerPoint presentation and patient guidebook. To enhance the learning environment the use of posters, models and videos may be added.At the conclusion of this workshop, patients will understand the  anatomy and physiology of the heart. Additionally, they will understand how Pritikins three pillars impact the risk factors, the progression, and the management of heart disease.  The purpose of this lesson is to provide a high-level overview of the heart, heart disease, and how the Pritikin lifestyle positively impacts risk factors.  Exercise Biomechanics Clinical staff led group instruction and group discussion with PowerPoint presentation and patient guidebook. To enhance the learning environment the use of posters, models and videos may be added. Patients will learn how the structural parts of their bodies function and how these functions impact their daily activities, movement, and exercise. Patients will learn how to promote a neutral spine, learn how to manage pain, and identify ways to improve their physical movement in order to promote healthy living. The purpose of this lesson is to expose patients to common physical limitations that impact physical activity. Participants will learn practical ways to adapt and manage aches and pains, and to minimize their effect on regular exercise. Patients will learn how to maintain good posture while sitting, walking, and lifting.  Location Manager and Fall Prevention  Clinical staff led group instruction and group discussion with PowerPoint presentation and patient guidebook. To enhance the learning environment the use of posters, models and videos may be added. At the conclusion of this workshop, patients will understand the importance of their sensorimotor skills (vision, proprioception, and the vestibular system) in maintaining their ability to balance as they age. Patients will apply a variety of balancing exercises that are appropriate for their current level of function. Patients will understand the common causes for poor balance, possible solutions to these problems, and ways to modify their physical environment in order to minimize their  fall risk. The purpose of this lesson is to teach patients about the importance of maintaining balance as they age and ways to minimize their risk of falling.  WORKSHOPS   Nutrition:  Fueling a Ship Broker led group instruction and group discussion with PowerPoint presentation and patient guidebook. To enhance the learning environment the use of posters, models and videos may be added. Patients will review the foundational principles of the Pritikin Eating Plan and understand what constitutes a serving size in each of the food groups. Patients will also learn Pritikin-friendly foods that are better choices when away from home and review make-ahead meal and snack options. Calorie density will be reviewed and applied to three nutrition priorities: weight maintenance, weight loss, and weight gain. The purpose of this lesson is to reinforce (in a group setting) the key concepts around what patients are recommended to eat and how to apply these guidelines when away from home by planning and selecting Pritikin-friendly options. Patients will understand how calorie density may be adjusted for different weight management goals.  Mindful Eating  Clinical staff led group instruction and group discussion with PowerPoint presentation and patient guidebook. To enhance the learning environment the use of posters, models and videos may be added. Patients will briefly review the concepts of the Pritikin Eating Plan and the importance of low-calorie dense foods. The concept of mindful eating will be introduced as well as the importance of paying attention to internal hunger signals. Triggers for non-hunger eating and techniques for dealing with triggers will be explored. The purpose of this lesson is to provide patients with the opportunity to review the basic principles of the Pritikin Eating Plan, discuss the value of eating mindfully and how to measure internal cues of hunger and fullness using the  Hunger Scale. Patients will also discuss reasons for non-hunger eating and learn strategies to use for controlling emotional eating.  Targeting Your Nutrition Priorities Clinical staff led group instruction and group discussion with PowerPoint presentation and patient guidebook. To enhance the learning environment the use of posters, models and videos may be added. Patients will learn how to determine their genetic susceptibility to disease by reviewing their family history. Patients will gain insight into the importance of diet as part of an overall healthy lifestyle in mitigating the impact of genetics and other environmental insults. The purpose of this lesson is to provide patients with the opportunity to assess their personal nutrition priorities by looking at their family history, their own health history and current risk factors. Patients will also be able to discuss ways of prioritizing and modifying the Pritikin Eating Plan for their highest risk areas  Menu  Clinical staff led group instruction and group discussion with PowerPoint presentation and patient guidebook. To enhance the learning environment the use of posters, models and videos may be added. Using menus brought in from local restaurants, or printed from  online menus, patients will apply the Pritikin dining out guidelines that were presented in the [employer] video. Patients will also be able to practice these guidelines in a variety of provided scenarios. The purpose of this lesson is to provide patients with the opportunity to practice hands-on learning of the Pritikin Dining Out guidelines with actual menus and practice scenarios.  Label Reading Clinical staff led group instruction and group discussion with PowerPoint presentation and patient guidebook. To enhance the learning environment the use of posters, models and videos may be added. Patients will review and discuss the Pritikin label reading guidelines  presented in Pritikins Label Reading Educational series video. Using fool labels brought in from local grocery stores and markets, patients will apply the label reading guidelines and determine if the packaged food meet the Pritikin guidelines. The purpose of this lesson is to provide patients with the opportunity to review, discuss, and practice hands-on learning of the Pritikin Label Reading guidelines with actual packaged food labels. Cooking School  Pritikins [employer] are designed to teach patients ways to prepare quick, simple, and affordable recipes at home. The importance of nutritions role in chronic disease risk reduction is reflected in its emphasis in the overall Pritikin program. By learning how to prepare essential core Pritikin Eating Plan recipes, patients will increase control over what they eat; be able to customize the flavor of foods without the use of added salt, sugar, or fat; and improve the quality of the food they consume. By learning a set of core recipes which are easily assembled, quickly prepared, and affordable, patients are more likely to prepare more healthy foods at home. These workshops focus on convenient breakfasts, simple entres, side dishes, and desserts which can be prepared with minimal effort and are consistent with nutrition recommendations for cardiovascular risk reduction. Cooking [employer] are taught by a armed forces logistics/support/administrative officer (RD) who has been trained by the [employer]. The chef or RD has a clear understanding of the importance of minimizing - if not completely eliminating - added fat, sugar, and sodium in recipes. Throughout the series of Cooking School Workshop sessions, patients will learn about healthy ingredients and efficient methods of cooking to build confidence in their capability to prepare    Cooking School weekly topics:  Adding Flavor- Sodium-Free  Fast and Healthy Breakfasts  Powerhouse Plant-Based  Proteins  Satisfying Salads and Dressings  Simple Sides and Sauces  International Cuisine-Spotlight on the [employer] Zones  Delicious Desserts  Savory Soups  [employer] - Meals in a Astronomer Appetizers and Snacks  Comforting Weekend Breakfasts  One-Pot Wonders   Fast Evening Meals  Landscape Architect Your Pritikin Plate  WORKSHOPS   Healthy Mindset (Psychosocial):  Focused Goals, Sustainable Changes Clinical staff led group instruction and group discussion with PowerPoint presentation and patient guidebook. To enhance the learning environment the use of posters, models and videos may be added. Patients will be able to apply effective goal setting strategies to establish at least one personal goal, and then take consistent, meaningful action toward that goal. They will learn to identify common barriers to achieving personal goals and develop strategies to overcome them. Patients will also gain an understanding of how our mind-set can impact our ability to achieve goals and the importance of cultivating a positive and growth-oriented mind-set. The purpose of this lesson is to provide patients with a deeper understanding of how to set and achieve personal goals, as well  as the tools and strategies needed to overcome common obstacles which may arise along the way.  From Head to Heart: The Power of a Healthy Outlook  Clinical staff led group instruction and group discussion with PowerPoint presentation and patient guidebook. To enhance the learning environment the use of posters, models and videos may be added. Patients will be able to recognize and describe the impact of emotions and mood on physical health. They will discover the importance of self-care and explore self-care practices which may work for them. Patients will also learn how to utilize the 4 Cs to cultivate a healthier outlook and better manage stress and challenges. The purpose of this lesson is to demonstrate  to patients how a healthy outlook is an essential part of maintaining good health, especially as they continue their cardiac rehab journey.  Healthy Sleep for a Healthy Heart Clinical staff led group instruction and group discussion with PowerPoint presentation and patient guidebook. To enhance the learning environment the use of posters, models and videos may be added. At the conclusion of this workshop, patients will be able to demonstrate knowledge of the importance of sleep to overall health, well-being, and quality of life. They will understand the symptoms of, and treatments for, common sleep disorders. Patients will also be able to identify daytime and nighttime behaviors which impact sleep, and they will be able to apply these tools to help manage sleep-related challenges. The purpose of this lesson is to provide patients with a general overview of sleep and outline the importance of quality sleep. Patients will learn about a few of the most common sleep disorders. Patients will also be introduced to the concept of sleep hygiene, and discover ways to self-manage certain sleeping problems through simple daily behavior changes. Finally, the workshop will motivate patients by clarifying the links between quality sleep and their goals of heart-healthy living.   Recognizing and Reducing Stress Clinical staff led group instruction and group discussion with PowerPoint presentation and patient guidebook. To enhance the learning environment the use of posters, models and videos may be added. At the conclusion of this workshop, patients will be able to understand the types of stress reactions, differentiate between acute and chronic stress, and recognize the impact that chronic stress has on their health. They will also be able to apply different coping mechanisms, such as reframing negative self-talk. Patients will have the opportunity to practice a variety of stress management techniques, such as deep  abdominal breathing, progressive muscle relaxation, and/or guided imagery.  The purpose of this lesson is to educate patients on the role of stress in their lives and to provide healthy techniques for coping with it.  Learning Barriers/Preferences:  Learning Barriers/Preferences - 04/17/24 1203       Learning Barriers/Preferences   Learning Barriers Sight   glasses   Learning Preferences Audio;Computer/Internet;Group Instruction;Individual Instruction;Skilled Demonstration;Verbal Instruction;Video;Written Material;Pictoral          Education Topics:  Knowledge Questionnaire Score:  Knowledge Questionnaire Score - 04/17/24 1209       Knowledge Questionnaire Score   Pre Score 24/24          Core Components/Risk Factors/Patient Goals at Admission:  Personal Goals and Risk Factors at Admission - 04/17/24 1204       Core Components/Risk Factors/Patient Goals on Admission    Weight Management Yes    Intervention Weight Management: Develop a combined nutrition and exercise program designed to reach desired caloric intake, while maintaining appropriate intake of nutrient and fiber, sodium  and fats, and appropriate energy expenditure required for the weight goal.;Weight Management: Provide education and appropriate resources to help participant work on and attain dietary goals.    Expected Outcomes Short Term: Continue to assess and modify interventions until short term weight is achieved;Long Term: Adherence to nutrition and physical activity/exercise program aimed toward attainment of established weight goal;Understanding recommendations for meals to include 15-35% energy as protein, 25-35% energy from fat, 35-60% energy from carbohydrates, less than 200mg  of dietary cholesterol, 20-35 gm of total fiber daily;Understanding of distribution of calorie intake throughout the day with the consumption of 4-5 meals/snacks    Hypertension Yes    Intervention Monitor prescription use  compliance.;Provide education on lifestyle modifcations including regular physical activity/exercise, weight management, moderate sodium restriction and increased consumption of fresh fruit, vegetables, and low fat dairy, alcohol moderation, and smoking cessation.    Expected Outcomes Long Term: Maintenance of blood pressure at goal levels.;Short Term: Continued assessment and intervention until BP is < 140/42mm HG in hypertensive participants. < 130/75mm HG in hypertensive participants with diabetes, heart failure or chronic kidney disease.    Lipids Yes    Intervention Provide education and support for participant on nutrition & aerobic/resistive exercise along with prescribed medications to achieve LDL 70mg , HDL >40mg .    Expected Outcomes Long Term: Cholesterol controlled with medications as prescribed, with individualized exercise RX and with personalized nutrition plan. Value goals: LDL < 70mg , HDL > 40 mg.;Short Term: Participant states understanding of desired cholesterol values and is compliant with medications prescribed. Participant is following exercise prescription and nutrition guidelines.          Core Components/Risk Factors/Patient Goals Review:    Core Components/Risk Factors/Patient Goals at Discharge (Final Review):    ITP Comments:  ITP Comments     Row Name 04/17/24 1159 05/08/24 1426 06/07/24 0856       ITP Comments Dr. Wilbert Bihari medical director. Introduction to pritikin education/intensive cardiac rehab. Initial orientation packet reviewed with patient. 30 Day ITP Review. Patient will start cardiac rehab in January 2026 want to participate in the 1015 class. 30 Day ITP Review. Patient will start cardiac rehab in January 2026 want to participate in the 1015 class.        Comments: See ITP comments.Hadassah Elpidio Quan RN BSN     [1]  Current Outpatient Medications:    acetaminophen  (TYLENOL ) 325 MG tablet, Take 2 tablets (650 mg total) by mouth every 6  (six) hours as needed for mild pain (pain score 1-3), moderate pain (pain score 4-6), fever or headache., Disp: , Rfl:    Ascorbic Acid (VITAMIN C) 1000 MG tablet, Take 1,000 mg by mouth daily., Disp: , Rfl:    aspirin  EC 81 MG tablet, Take 1 tablet (81 mg total) by mouth daily. Swallow whole., Disp: 30 tablet, Rfl: 2   atorvastatin  (LIPITOR) 40 MG tablet, Take 1 tablet (40 mg total) by mouth daily., Disp: 30 tablet, Rfl: 2   carvedilol  (COREG ) 25 MG tablet, Take 1 tablet (25 mg total) by mouth 2 (two) times daily with a meal., Disp: 180 tablet, Rfl: 3   docusate sodium  (COLACE) 100 MG capsule, Take 1 capsule (100 mg total) by mouth daily as needed for mild constipation or moderate constipation., Disp: , Rfl:    magnesium chloride (SLOW-MAG) 64 MG TBEC SR tablet, Take 71.5 mg by mouth 2 (two) times daily. Reported on 12/17/2015 (Patient taking differently: Take 2 tablets by mouth 2 (two) times daily. Reported on 12/17/2015),  Disp: , Rfl:    Multiple Vitamins-Minerals (CENTRUM SILVER) tablet, Take 1 tablet by mouth daily. , Disp: , Rfl:    nitroGLYCERIN  (NITROSTAT ) 0.4 MG SL tablet, Place 1 tablet (0.4 mg total) under the tongue every 5 (five) minutes as needed for chest pain. Up to only 3 doses, Disp: 25 tablet, Rfl: 3   pyridOXINE (VITAMIN B-6) 50 MG tablet, Take 50 mg by mouth daily., Disp: , Rfl:    ticagrelor  (BRILINTA ) 90 MG TABS tablet, Take 1 tablet (90 mg total) by mouth 2 (two) times daily., Disp: 60 tablet, Rfl: 2 [2]  Social History Tobacco Use  Smoking Status Never  Smokeless Tobacco Never

## 2024-06-07 NOTE — Telephone Encounter (Signed)
 Left message to call cardiac rehab. Calling to confirm that the patient still wants to participate in the program.Jakaree Pickard Elpidio Quan RN BSN

## 2024-06-09 ENCOUNTER — Encounter (HOSPITAL_COMMUNITY)
Admission: RE | Admit: 2024-06-09 | Discharge: 2024-06-09 | Disposition: A | Discharge status: Home / Self Care | Source: Ambulatory Visit | Attending: Internal Medicine | Admitting: Internal Medicine

## 2024-06-09 ENCOUNTER — Encounter (HOSPITAL_COMMUNITY): Payer: Self-pay

## 2024-06-09 DIAGNOSIS — Z955 Presence of coronary angioplasty implant and graft: Secondary | ICD-10-CM | POA: Insufficient documentation

## 2024-06-09 DIAGNOSIS — I214 Non-ST elevation (NSTEMI) myocardial infarction: Secondary | ICD-10-CM | POA: Insufficient documentation

## 2024-06-09 NOTE — Progress Notes (Signed)
 Daily Session Note  Patient Details  Name: Cindy Anderson MRN: [id number] Date of Birth: 1945-08-16 Referring Provider:   Flowsheet Row INTENSIVE CARDIAC REHAB ORIENT from 04/17/2024 in [facility]  Referring Provider [name], MD    Encounter Date: 06/09/2024  Check In:  Session Check In - 06/09/24 1010       Check-In   Supervising physician immediately available to respond to emergencies [facility] MD immediately available    Physician(s) Rosabel Mose NP    Location [facility]    Staff Present Hadassah Quan, RN, Mallory Parkins, MS, ACSM-CEP, CCRP, Exercise Physiologist;Jetta Vannie BS, ACSM-CEP, Exercise Physiologist;Joseph Lennon, RN, Maud Moats, MS, ACSM-CEP, Exercise Physiologist    Virtual Visit No    Medication changes reported     Yes    Comments Correg 25 mg BId    Fall or balance concerns reported    No    Tobacco Cessation No Change    Warm-up and Cool-down Performed as group-led instruction   CRP2 orientation   Resistance Training Performed Yes    VAD Patient? No    PAD/SET Patient? No      Pain Assessment   Currently in Pain? No/denies    Pain Score 0-No pain    Multiple Pain Sites No          Capillary Blood Glucose: No results found for this or any previous visit (from the past 24 hours).   Exercise Prescription Changes - 06/09/24 1400       Response to Exercise   Blood Pressure (Admit) 134/62    Blood Pressure (Exercise) 174/80    Blood Pressure (Exit) 142/72    Heart Rate (Admit) 65 bpm    Heart Rate (Exercise) 105 bpm    Heart Rate (Exit) 55 bpm    Rating of Perceived Exertion (Exercise) 11    Duration Progress to 30 minutes of  aerobic without signs/symptoms of physical distress    Intensity THRR unchanged      Progression   Progression Continue to progress workloads to maintain intensity without signs/symptoms of physical distress.    Average METs 2       Resistance Training   Weight Wts not done    Reps 10-15    Time 10 Minutes      NuStep   Level 1    SPM 62    Minutes 15    METs 2      Track   Laps 7    Minutes 15    METs 1.89          Tobacco Use History[1]  Goals Met:  No report of concerns or symptoms today  Goals Unmet:  BP  Comments: Pt started cardiac rehab today.  Pt tolerated light exercise without difficulty. Entry blood pressure 134/62 , Recheck BP 174/80 on the nustep on level 1. Recheck BP 172/74 after sititng down to rest blood pressure was noted at 156/64. Will get BP parameters. Blood pressure on the tack was noted at 186/72 will not allow patient to do weights today, will discuss with onsite provider Katlyn West NP telemetry-Sinus Rhythm, asymptomatic.  Medication list reconciled. Pt denies barriers to medicaiton compliance.  PSYCHOSOCIAL ASSESSMENT:  PHQ-0. Pt exhibits positive coping skills, hopeful outlook with supportive family. No psychosocial needs identified at this time, no psychosocial interventions necessary.    Pt enjoys ice skating, crocheting.   Pt oriented to exercise equipment and routine.  Understanding verbalized.Hadassah Elpidio Quan RN BSN     Dr. Wilbert Bihari is Medical Director for Cardiac Rehab at [facility].     [1]  Social History Tobacco Use  Smoking Status Never  Smokeless Tobacco Never

## 2024-06-12 ENCOUNTER — Encounter (HOSPITAL_COMMUNITY)

## 2024-06-12 ENCOUNTER — Encounter (HOSPITAL_COMMUNITY): Payer: Self-pay

## 2024-06-12 ENCOUNTER — Telehealth (HOSPITAL_COMMUNITY): Payer: Self-pay | Admitting: *Deleted

## 2024-06-12 ENCOUNTER — Telehealth: Payer: Self-pay | Admitting: Medical

## 2024-06-12 NOTE — Telephone Encounter (Signed)
 Spoke with Cindy Anderson. Asked to hold off on attending exercise today. Will obtain BP parameters from Dr Oneda office. Patient states understanding.Cindy Elpidio Quan RN BSN

## 2024-06-12 NOTE — Telephone Encounter (Signed)
 Calling in regards to the patient bp. Please advise

## 2024-06-13 ENCOUNTER — Telehealth: Payer: Self-pay

## 2024-06-13 DIAGNOSIS — Z79899 Other long term (current) drug therapy: Secondary | ICD-10-CM

## 2024-06-13 MED ORDER — LOSARTAN POTASSIUM 25 MG PO TABS: 25.0000 mg | ORAL_TABLET | Freq: Every day | ORAL | 3 refills | Status: AC

## 2024-06-13 NOTE — Telephone Encounter (Signed)
 Pt made aware and verbalized understanding.  Medication list updated. Pt advised to keep scheduled appointment on 06/27/24  Furth, Cadence H, PA-C  Whitaker, Hadassah ORN, RN Cc: Desiderio Russell SAILOR, RN It's a little difficult since she says blood pressures at home are relatively normal. We can start losartan  25mg  daily, BMET in 2 weeks and she can continue cardiac rehab. We can also see her back in the office in 2 weeks.

## 2024-06-14 ENCOUNTER — Encounter (HOSPITAL_COMMUNITY): Payer: Self-pay

## 2024-06-14 ENCOUNTER — Encounter (HOSPITAL_COMMUNITY): Admission: RE | Admit: 2024-06-14 | Source: Ambulatory Visit

## 2024-06-16 ENCOUNTER — Encounter (HOSPITAL_COMMUNITY): Payer: Self-pay

## 2024-06-16 ENCOUNTER — Encounter (HOSPITAL_COMMUNITY)

## 2024-06-19 ENCOUNTER — Encounter (HOSPITAL_COMMUNITY): Admission: RE | Admit: 2024-06-19 | Source: Ambulatory Visit

## 2024-06-19 ENCOUNTER — Telehealth (HOSPITAL_COMMUNITY): Payer: Self-pay | Admitting: *Deleted

## 2024-06-19 ENCOUNTER — Encounter (HOSPITAL_COMMUNITY): Payer: Self-pay

## 2024-06-19 NOTE — Telephone Encounter (Signed)
 Left message to call cardiac rehab.Hadassah Elpidio Quan RN BSN

## 2024-06-19 NOTE — Telephone Encounter (Signed)
-----   Message from Timothy Gollan, MD sent at 06/17/2024 12:54 PM EST ----- Thanks for your message Cindy Anderson, I do not think I have had a chance to meet this patient in the past I will forward to Cadence to see if she wants to make medication adjustments before her next clinic visit.  Thx TGollan ----- Message ----- From: Cyrus Cindy Anderson ORN, RN Sent: 06/12/2024  10:06 AM EST To: Cindy JINNY Lunger, MD; Jon JONETTA Gasmen, RN  Good morning Dr Anderson Patient's blood pressures were elevated at cardiac rehab please see attached media. Please advise what is an acceptable systolic BP for exercise at cardiac rehab. Should Ms Sheeran hold off on exercise at cardiac rehab until see sees Cadence in the office on 07/01/23. I appreciate your input! Sincerely, [facility]  Cardiac Rehab

## 2024-06-21 ENCOUNTER — Encounter (HOSPITAL_COMMUNITY): Payer: Self-pay

## 2024-06-21 ENCOUNTER — Other Ambulatory Visit: Payer: Self-pay | Admitting: Internal Medicine

## 2024-06-21 ENCOUNTER — Encounter (HOSPITAL_COMMUNITY)

## 2024-06-22 NOTE — Telephone Encounter (Signed)
 Lipid Panel done on 05/09/24

## 2024-06-23 ENCOUNTER — Encounter (HOSPITAL_COMMUNITY): Admission: RE | Admit: 2024-06-23 | Source: Ambulatory Visit

## 2024-06-23 ENCOUNTER — Other Ambulatory Visit: Payer: Self-pay | Admitting: Medical

## 2024-06-23 ENCOUNTER — Encounter (HOSPITAL_COMMUNITY): Payer: Self-pay

## 2024-06-26 ENCOUNTER — Encounter (HOSPITAL_COMMUNITY)

## 2024-06-26 ENCOUNTER — Telehealth: Payer: Self-pay | Admitting: Medical

## 2024-06-26 ENCOUNTER — Encounter (HOSPITAL_COMMUNITY): Payer: Self-pay

## 2024-06-26 MED ORDER — TICAGRELOR 90 MG PO TABS: 90.0000 mg | ORAL_TABLET | Freq: Two times a day (BID) | ORAL | 3 refills | Status: AC

## 2024-06-26 NOTE — Telephone Encounter (Signed)
" °*  STAT* If patient is at the pharmacy, call can be transferred to refill team.   1. Which medications need to be refilled? (please list name of each medication and dose if known)   ticagrelor  (BRILINTA ) 90 MG TABS tablet    2. Which pharmacy/location (including street and city if local pharmacy) is medication to be sent to?  [id number] - [place], [place] - [address]    3. Do they need a 30 day or 90 day supply? 90   Patient is out of medication  "

## 2024-06-26 NOTE — Telephone Encounter (Signed)
Pt following up. Please advise.

## 2024-06-27 ENCOUNTER — Ambulatory Visit: Attending: Medical | Admitting: Medical

## 2024-06-27 ENCOUNTER — Encounter: Payer: Self-pay | Admitting: Medical

## 2024-06-27 VITALS — BP 200/80 | HR 73 | Ht 61.0 in | Wt 150.8 lb

## 2024-06-27 DIAGNOSIS — I214 Non-ST elevation (NSTEMI) myocardial infarction: Secondary | ICD-10-CM | POA: Diagnosis present

## 2024-06-27 DIAGNOSIS — E782 Mixed hyperlipidemia: Secondary | ICD-10-CM | POA: Diagnosis present

## 2024-06-27 DIAGNOSIS — Z79899 Other long term (current) drug therapy: Secondary | ICD-10-CM | POA: Insufficient documentation

## 2024-06-27 DIAGNOSIS — R600 Localized edema: Secondary | ICD-10-CM | POA: Diagnosis not present

## 2024-06-27 DIAGNOSIS — I2510 Atherosclerotic heart disease of native coronary artery without angina pectoris: Secondary | ICD-10-CM | POA: Diagnosis not present

## 2024-06-27 DIAGNOSIS — I10 Essential (primary) hypertension: Secondary | ICD-10-CM | POA: Insufficient documentation

## 2024-06-27 MED ORDER — HYDROCHLOROTHIAZIDE 12.5 MG PO CAPS: 12.5000 mg | ORAL_CAPSULE | Freq: Every day | ORAL | 3 refills | Status: AC

## 2024-06-27 MED ORDER — HYDRALAZINE HCL 50 MG PO TABS: 50.0000 mg | ORAL_TABLET | Freq: Two times a day (BID) | ORAL | 3 refills | Status: AC

## 2024-06-27 NOTE — Patient Instructions (Signed)
 Medication Instructions:  Your physician recommends the following medication changes.  START TAKING: Hydralazine  50 mg by mouth twice daily  Hydrochlorothiazide  12.5 mg by mouth daily    *If you need a refill on your cardiac medications before your next appointment, please call your pharmacy*  Lab Work: Your provider would like for you to have following labs drawn today Pro BNP, CMP, CBC.    Testing/Procedures: No test ordered today   Follow-Up: At [facility], you and your health needs are our priority.  As part of our continuing mission to provide you with exceptional heart care, our providers are all part of one team.  This team includes your primary Cardiologist (physician) and Advanced Practice Providers or APPs (Physician Assistants and Nurse Practitioners) who all work together to provide you with the care you need, when you need it.  Your next appointment:   1 month(s)  Provider:   Mikey Fishman, PA-C

## 2024-06-27 NOTE — Progress Notes (Unsigned)
 " Cardiology Office Note   Date:  06/27/2024  ID:  Tyreka Henneke, DOB 11/09/1945, MRN [id number] PCP: Patient, No Pcp Per  [facility] Providers Cardiologist:  None  History of Present Illness Tiki Tucciarone is a 79 y.o. female with a h/o endometrial cancer (in remission since 2020)s/p hysterectomy, CAD who presents for follow-up of CAD.    Patient was admitted in late October 2025 with non-STEMI.  High-sensitivity troponin peaked at 788.  She underwent cardiac cath revealing high-grade mid to distal dominant circumflex disease which was stented x 1.  She had residual moderate nonflow-limiting disease.  LV function was essentially normal by ventriculography.  She was started on DAPT with aspirin  and Brilinta  x 12 months.   The patient was last seen 04/07/2024 and was overall doing well.  Blood pressures were mildly elevated but she did not want to make any changes.  She was referred to cardiac rehab in [place].  The patient was last seen 05/09/24 reporting improved BP.   Today, blood pressure is very high. BP at home BP is better- 130-134s, highest 160s. At cardiac rehab 134/62. She feels normal. She denies dizziness or headache.  BP at home today 125/62.she compared BP  machine at rehab and it was 5-35mmHg lower. She stopped losartan  due to vomiting. Once she stopped losartan  symptoms gradually stopped. She is wanting to lose weight. They are eating more plant based. She has been walking.  Studies Reviewed      Cardiac catheterization/PCI and stent (03/27/2024)   Conclusion   Conclusions: Severe single-vessel coronary artery disease with 95% stenosis of distal LCx (dominant vessel) consistent with acute plaque rupture.  There is also moderate, approaching severe disease involving the LAD and proximal/mid LCx. Preserved left ventricular systolic function with subtle basal inferior hypokinesis (LVEF 55-65%).  Mildly elevated left ventricular filling pressure (LVEDP 19  mmHg). Successful PCI to distal LCx using Synergy XD 2.25 x 12 mm drug-eluting stent with 0% residual stenosis and TIMI-3 flow.   Recommendations: Dual antiplatelet therapy with aspirin  and ticagrelor  for at least 12 months. Aggressive secondary prevention of coronary artery disease. Favor medical therapy for residual LAD and LCx disease. Follow-up echocardiogram.   [name], MD [facility] Coronary Diagrams   Diagnostic Dominance: Left  Intervention          Echo 03/2024 1. Left ventricular ejection fraction, by estimation, is 60 to 65%. The  left ventricle has normal function. The left ventricle has no regional  wall motion abnormalities. Left ventricular diastolic parameters were  normal. There is moderate hypokinesis of   the left ventricular, basal-mid inferior wall. The average left  ventricular global longitudinal strain is -20.8 %. The global longitudinal  strain is normal.   2. Right ventricular systolic function is normal. The right ventricular  size is normal. Tricuspid regurgitation signal is inadequate for assessing  PA pressure.   3. The mitral valve is normal in structure. Mild mitral valve  regurgitation. No evidence of mitral stenosis.   4. The aortic valve is tricuspid. There is mild calcification of the  aortic valve. Aortic valve regurgitation is not visualized. Aortic valve  sclerosis/calcification is present, without any evidence of aortic  stenosis.   5. The inferior vena cava is normal in size with greater than 50%  respiratory variability, suggesting right atrial pressure of 3 mmHg.      Physical Exam VS:  BP (!) 200/80   Pulse 73   Ht 5' 1 (1.549 m)   Wt  150 lb 12.8 oz (68.4 kg)   SpO2 97%   BMI 28.49 kg/m        Wt Readings from Last 3 Encounters:  06/27/24 150 lb 12.8 oz (68.4 kg)  05/09/24 151 lb 6.4 oz (68.7 kg)  04/17/24 153 lb 10.6 oz (69.7 kg)    GEN: Well nourished, well developed in no acute distress NECK: No JVD;  No carotid bruits CARDIAC: RRR, no murmurs, rubs, gallops RESPIRATORY:  Clear to auscultation without rales, wheezing or rhonchi  ABDOMEN: Soft, non-tender, non-distended EXTREMITIES:  No edema; No deformity   ASSESSMENT AND PLAN  NSTEMI CAD s/p PCI/DES x 1 Lcx 03/2024 The patient denies chest pain or SOB. She has been doing cardiac rehab without symptoms. However cardiac rehab has been paused due to persistently high blood pressures. BP today is severely elevated, although they seem to be much better at home. No plan for ischemic work-up at this time. Continue ASA, Brilinta , Lipitor, Coreg  and SL NTG.   HTN BP is severely elevated today. Re-check 200/80. The patient checks her BP daily and says sytolics is normally 130-140s. BP at home today was 126/62. She did not tolerate Losartan  due to GI issues. She is only taking Coreg  25mg BID. I will start hydralazine  50mg BID and hydrochlorothiazide  12.5mg  daily. She may degree of whitecoat syndrome.   HLD LDL 66. She reports leg pain after walking. We will continue to monitor symptoms at this point.   Lower leg edema Mild MR Left worse then the right. I will start hydrochlorothiazide  as above. Echo in October 2025 showed normal LVEF 60-65%, no WMA, moderate HK of the left ventricular, basal-mid inf wall, mid MR.       Dispo: Follow-up in 2 months  Signed, Jusitn Salsgiver VEAR Fishman, PA-C   "

## 2024-06-28 ENCOUNTER — Encounter (HOSPITAL_COMMUNITY): Payer: Self-pay

## 2024-06-28 ENCOUNTER — Encounter (HOSPITAL_COMMUNITY)

## 2024-06-28 LAB — CBC
Hematocrit: 39.6 % (ref 34.0–46.6)
Hemoglobin: 12.8 g/dL (ref 11.1–15.9)
MCH: 27.9 pg (ref 26.6–33.0)
MCHC: 32.3 g/dL (ref 31.5–35.7)
MCV: 86 fL (ref 79–97)
Platelets: 232 x10E3/uL (ref 150–450)
RBC: 4.59 x10E6/uL (ref 3.77–5.28)
RDW: 13.7 % (ref 11.7–15.4)
WBC: 8.2 x10E3/uL (ref 3.4–10.8)

## 2024-06-28 LAB — PRO B NATRIURETIC PEPTIDE: NT-Pro BNP: 576 pg/mL (ref 0–738)

## 2024-06-28 LAB — COMPREHENSIVE METABOLIC PANEL WITH GFR
ALT: 27 IU/L (ref 0–32)
AST: 30 IU/L (ref 0–40)
Albumin: 4.6 g/dL (ref 3.8–4.8)
Alkaline Phosphatase: 63 IU/L (ref 49–135)
BUN/Creatinine Ratio: 14 (ref 12–28)
BUN: 11 mg/dL (ref 8–27)
Bilirubin Total: 0.6 mg/dL (ref 0.0–1.2)
CO2: 22 mmol/L (ref 20–29)
Calcium: 9.8 mg/dL (ref 8.7–10.3)
Chloride: 105 mmol/L (ref 96–106)
Creatinine, Ser: 0.78 mg/dL (ref 0.57–1.00)
Globulin, Total: 2 g/dL (ref 1.5–4.5)
Glucose: 106 mg/dL — ABNORMAL HIGH (ref 70–99)
Potassium: 5 mmol/L (ref 3.5–5.2)
Sodium: 142 mmol/L (ref 134–144)
Total Protein: 6.6 g/dL (ref 6.0–8.5)
eGFR: 78 mL/min/1.73

## 2024-06-28 NOTE — Telephone Encounter (Signed)
 Pt's medication was already sent to pt's pharmacy as requested. Confirmation received.

## 2024-06-29 ENCOUNTER — Ambulatory Visit: Payer: Self-pay | Admitting: Medical

## 2024-06-30 ENCOUNTER — Encounter (HOSPITAL_COMMUNITY): Payer: Self-pay

## 2024-06-30 ENCOUNTER — Encounter (HOSPITAL_COMMUNITY)

## 2024-07-03 ENCOUNTER — Encounter (HOSPITAL_COMMUNITY)

## 2024-07-03 ENCOUNTER — Encounter (HOSPITAL_COMMUNITY): Payer: Self-pay

## 2024-07-04 ENCOUNTER — Encounter (HOSPITAL_COMMUNITY): Payer: Self-pay | Admitting: *Deleted

## 2024-07-04 DIAGNOSIS — Z955 Presence of coronary angioplasty implant and graft: Secondary | ICD-10-CM

## 2024-07-04 DIAGNOSIS — I214 Non-ST elevation (NSTEMI) myocardial infarction: Secondary | ICD-10-CM

## 2024-07-04 NOTE — Progress Notes (Signed)
 Tommi attended 5 exercise and education classes between 04/17/24-06/09/24. Cambrey had  exertional BP elevations on the last day she attended exercise. Joely has been discharged from cardiac rehab due to non attendance.Hadassah Elpidio Quan RN BSN

## 2024-07-05 ENCOUNTER — Encounter (HOSPITAL_COMMUNITY)

## 2024-07-05 ENCOUNTER — Encounter (HOSPITAL_COMMUNITY): Payer: Self-pay

## 2024-07-07 ENCOUNTER — Encounter (HOSPITAL_COMMUNITY): Payer: Self-pay

## 2024-07-07 ENCOUNTER — Encounter (HOSPITAL_COMMUNITY)

## 2024-07-10 ENCOUNTER — Encounter (HOSPITAL_COMMUNITY): Payer: Self-pay

## 2024-07-10 ENCOUNTER — Encounter (HOSPITAL_COMMUNITY)

## 2024-07-12 ENCOUNTER — Encounter (HOSPITAL_COMMUNITY): Payer: Self-pay

## 2024-07-12 ENCOUNTER — Encounter (HOSPITAL_COMMUNITY)

## 2024-07-14 ENCOUNTER — Encounter (HOSPITAL_COMMUNITY)

## 2024-07-17 ENCOUNTER — Encounter (HOSPITAL_COMMUNITY)

## 2024-07-19 ENCOUNTER — Encounter (HOSPITAL_COMMUNITY)

## 2024-07-21 ENCOUNTER — Encounter (HOSPITAL_COMMUNITY)

## 2024-07-24 ENCOUNTER — Encounter (HOSPITAL_COMMUNITY)

## 2024-07-26 ENCOUNTER — Encounter (HOSPITAL_COMMUNITY)

## 2024-07-28 ENCOUNTER — Encounter (HOSPITAL_COMMUNITY)

## 2024-07-31 ENCOUNTER — Encounter (HOSPITAL_COMMUNITY)

## 2024-08-02 ENCOUNTER — Ambulatory Visit: Admitting: Medical

## 2024-08-02 ENCOUNTER — Encounter (HOSPITAL_COMMUNITY)

## 2024-08-04 ENCOUNTER — Encounter (HOSPITAL_COMMUNITY)

## 2024-08-07 ENCOUNTER — Encounter (HOSPITAL_COMMUNITY)

## 2024-08-09 ENCOUNTER — Encounter (HOSPITAL_COMMUNITY)

## 2024-08-11 ENCOUNTER — Encounter (HOSPITAL_COMMUNITY)

## 2024-08-14 ENCOUNTER — Encounter (HOSPITAL_COMMUNITY)
